# Patient Record
Sex: Female | Born: 1995 | Race: Black or African American | Hispanic: No | Marital: Single | Smoking: Never smoker
Health system: Southern US, Community
[De-identification: ages and names within clinical notes are randomized; demographics above are authoritative.]

## PROBLEM LIST (undated history)

## (undated) DIAGNOSIS — F32A Depression, unspecified: Secondary | ICD-10-CM

## (undated) DIAGNOSIS — G43909 Migraine, unspecified, not intractable, without status migrainosus: Secondary | ICD-10-CM

## (undated) DIAGNOSIS — T7840XA Allergy, unspecified, initial encounter: Secondary | ICD-10-CM

## (undated) DIAGNOSIS — F909 Attention-deficit hyperactivity disorder, unspecified type: Secondary | ICD-10-CM

## (undated) DIAGNOSIS — F329 Major depressive disorder, single episode, unspecified: Secondary | ICD-10-CM

## (undated) HISTORY — PX: TONSILLECTOMY: SUR1361

## (undated) HISTORY — PX: TYMPANOSTOMY TUBE PLACEMENT: SHX32

## (undated) HISTORY — DX: Migraine, unspecified, not intractable, without status migrainosus: G43.909

## (undated) HISTORY — DX: Allergy, unspecified, initial encounter: T78.40XA

## (undated) HISTORY — DX: Attention-deficit hyperactivity disorder, unspecified type: F90.9

---
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
## 2016-01-14 ENCOUNTER — Ambulatory Visit: Payer: Medicaid Other | Admitting: Diagnostic Neuroimaging

## 2016-02-02 ENCOUNTER — Ambulatory Visit: Payer: Medicaid Other | Admitting: Diagnostic Neuroimaging

## 2016-02-03 ENCOUNTER — Encounter: Payer: Self-pay | Admitting: Diagnostic Neuroimaging

## 2016-02-04 ENCOUNTER — Emergency Department (HOSPITAL_COMMUNITY)
Admission: EM | Admit: 2016-02-04 | Discharge: 2016-02-04 | Disposition: A | Discharge status: Home / Self Care | Payer: Medicaid Other | Attending: Emergency Medicine | Admitting: Emergency Medicine

## 2016-02-04 ENCOUNTER — Emergency Department (HOSPITAL_COMMUNITY): Payer: Medicaid Other

## 2016-02-04 ENCOUNTER — Encounter (HOSPITAL_COMMUNITY): Payer: Self-pay | Admitting: Emergency Medicine

## 2016-02-04 DIAGNOSIS — F909 Attention-deficit hyperactivity disorder, unspecified type: Secondary | ICD-10-CM | POA: Insufficient documentation

## 2016-02-04 DIAGNOSIS — G43909 Migraine, unspecified, not intractable, without status migrainosus: Secondary | ICD-10-CM | POA: Insufficient documentation

## 2016-02-04 DIAGNOSIS — S92032A Displaced avulsion fracture of tuberosity of left calcaneus, initial encounter for closed fracture: Secondary | ICD-10-CM | POA: Insufficient documentation

## 2016-02-04 DIAGNOSIS — S93402A Sprain of unspecified ligament of left ankle, initial encounter: Secondary | ICD-10-CM

## 2016-02-04 DIAGNOSIS — F329 Major depressive disorder, single episode, unspecified: Secondary | ICD-10-CM | POA: Insufficient documentation

## 2016-02-04 DIAGNOSIS — S92002A Unspecified fracture of left calcaneus, initial encounter for closed fracture: Secondary | ICD-10-CM

## 2016-02-04 DIAGNOSIS — Z7951 Long term (current) use of inhaled steroids: Secondary | ICD-10-CM | POA: Insufficient documentation

## 2016-02-04 DIAGNOSIS — Y998 Other external cause status: Secondary | ICD-10-CM | POA: Insufficient documentation

## 2016-02-04 DIAGNOSIS — Y9301 Activity, walking, marching and hiking: Secondary | ICD-10-CM | POA: Insufficient documentation

## 2016-02-04 DIAGNOSIS — Z79899 Other long term (current) drug therapy: Secondary | ICD-10-CM | POA: Insufficient documentation

## 2016-02-04 DIAGNOSIS — X58XXXA Exposure to other specified factors, initial encounter: Secondary | ICD-10-CM | POA: Insufficient documentation

## 2016-02-04 DIAGNOSIS — Y9289 Other specified places as the place of occurrence of the external cause: Secondary | ICD-10-CM | POA: Insufficient documentation

## 2016-02-04 MED ORDER — IBUPROFEN 600 MG PO TABS: 600.0000 mg | ORAL_TABLET | Freq: Four times a day (QID) | ORAL | Status: DC | PRN

## 2016-02-04 MED ORDER — HYDROCODONE-ACETAMINOPHEN 5-325 MG PO TABS: 1.0000 | ORAL_TABLET | ORAL | Status: DC | PRN

## 2016-02-04 MED ORDER — TRAMADOL HCL 50 MG PO TABS: 50.0000 mg | ORAL_TABLET | Freq: Four times a day (QID) | ORAL | Status: DC | PRN

## 2016-02-04 MED ORDER — HYDROCODONE-ACETAMINOPHEN 5-325 MG PO TABS
2.0000 | ORAL_TABLET | Freq: Once | ORAL | Status: AC
  Administered 2016-02-04: 2 via ORAL
  Filled 2016-02-04: qty 2

## 2016-02-04 NOTE — Discharge Instructions (Signed)

## 2016-02-04 NOTE — ED Notes (Signed)
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

## 2016-02-04 NOTE — ED Notes (Signed)
Bed: [id number] Expected date:  Expected time:  Means of arrival:  Comments: EMS 20yo F ankle injury

## 2016-02-04 NOTE — ED Provider Notes (Signed)
CSN: [id number]     Arrival date & time 02/04/16  2244 History   By signing my name below, I, [facility], attest that this documentation has been prepared under the direction and in the presence of [employer], PA-C. Electronically Signed: Virgel Bouquet, ED Scribe. 02/04/2016. 11:42 PM.     Chief Complaint  Patient presents with  . Ankle Pain    The history is provided by the patient. No language interpreter was used.   HPI Comments: Hannah Palmer is a 20 y.o. female who presents to the Emergency Department complaining of constant, mild left ankle pain after an injury that occurred earlier today. Patient reports that she was walking downstairs during a fire drill when she rolled her ankle, followed immediately by pain and swelling. She endorses associated swelling and difficulty walking secondary to pain. She has applied ice with relief of her swelling. Denies paraesthesia or numbness.  Past Medical History  Diagnosis Date  . ADHD (attention deficit hyperactivity disorder)   . Allergy   . Depression   . Migraine    Past Surgical History  Procedure Laterality Date  . Adenoidectomy  2001  . Tonsillectomy  Age 62  . Tympanostomy tube placement    . Base wedge osteotomy Right 02/27/2014    @ [place]  . Osteotomy Right 02/27/2014    Met Head Rt #5 @ [place]   Family History  Problem Relation Age of Onset  . Bipolar disorder Mother   . Stroke Mother   . ADD / ADHD Father   . Bipolar disorder Maternal Aunt   . Stroke Maternal Aunt   . Depression Maternal Grandmother   . Hypertension Maternal Grandmother   . Hyperlipidemia Maternal Grandmother   . Heart disease Maternal Grandmother   . Diabetes Maternal Grandmother   . Arthritis Maternal Grandmother   . Skin cancer Maternal Grandmother   . Diabetes Maternal Grandfather   . Stroke Maternal Grandfather   . Colon cancer Maternal Grandfather   . Diabetes Paternal Grandmother   . Diabetes Paternal Grandfather    Social  History  Substance Use Topics  . Smoking status: Passive Smoke Exposure - Never Smoker  . Smokeless tobacco: Never Used  . Alcohol Use: No   OB History    No data available     Review of Systems  Musculoskeletal: Positive for joint swelling (left ankle) and arthralgias (left ankle).  Neurological: Negative for numbness.  All other systems reviewed and are negative.   Allergies  Review of patient's allergies indicates no known allergies.  Home Medications   Prior to Admission medications   Medication Sig Start Date End Date Taking? Authorizing Provider  acetaminophen (TYLENOL) 325 MG tablet Take 650 mg by mouth every 6 (six) hours as needed.    Historical Provider, MD  amphetamine-dextroamphetamine (ADDERALL XR) 30 MG 24 hr capsule Take 1 capsule (30 mg total) by mouth daily with breakfast. 09/22/14   Marcha Solders, MD  Aspirin-Acetaminophen-Caffeine (EXCEDRIN EXTRA STRENGTH PO) Take by mouth. As needed    Historical Provider, MD  citalopram (CELEXA) 20 MG tablet Take 20 mg by mouth.    Historical Provider, MD  fluticasone (FLONASE) 50 MCG/ACT nasal spray Place 1 spray into the nose. 06/11/14   Historical Provider, MD  GUANFACINE HCL PO Take 1 tablet by mouth daily.    Historical Provider, MD  HYDROcodone-acetaminophen (NORCO/VICODIN) 5-325 MG tablet Take 1 tablet by mouth every 4 (four) hours as needed for severe pain. 02/04/16   Antonietta Breach,  PA-C  ibuprofen (ADVIL,MOTRIN) 600 MG tablet Take 1 tablet (600 mg total) by mouth every 6 (six) hours as needed. 02/04/16   Antonietta Breach, PA-C  polyethylene glycol powder (GLYCOLAX/MIRALAX) powder Take 17 g by mouth daily. 12/02/15   Marcha Solders, MD  senna (SENOKOT) 8.6 MG TABS tablet Take 1 tablet (8.6 mg total) by mouth daily. 12/22/15 03/20/16  Leveda Anna, NP  sodium chloride (OCEAN) 0.65 % SOLN nasal spray Place 2 sprays into both nostrils as needed for congestion. 12/22/15 01/19/16  Leveda Anna, NP  topiramate (TOPAMAX) 50 MG tablet Take 1  tablet (50 mg total) by mouth 2 (two) times daily. 12/03/15   Penni Bombard, MD  vitamin B-12 (CYANOCOBALAMIN) 1000 MCG tablet Take 1 tablet (1,000 mcg total) by mouth daily. Patient may resume home supply 12/14/12   Aurelio Jew, NP   BP 129/76 mmHg  Pulse 78  Temp(Src) 98.8 F (37.1 C) (Oral)  Resp 16  SpO2 99%  LMP  (LMP Unknown)   Physical Exam  Constitutional: She is oriented to person, place, and time. She appears well-developed and well-nourished. No distress.  HENT:  Head: Normocephalic and atraumatic.  Eyes: Conjunctivae and EOM are normal. No scleral icterus.  Neck: Normal range of motion.  Cardiovascular: Normal rate, regular rhythm and intact distal pulses.   DP and PT pulses 2+ in the LLE  Pulmonary/Chest: Effort normal. No respiratory distress.  Respirations even and unlabored  Musculoskeletal: Normal range of motion.       Left ankle: She exhibits swelling. She exhibits normal range of motion, no deformity and normal pulse. Tenderness. Lateral malleolus and medial malleolus tenderness found. Achilles tendon normal.       Feet:  Neurological: She is alert and oriented to person, place, and time. She exhibits normal muscle tone. Coordination normal.  Sensation to light touch intact. Patient able to wiggle all toes.  Skin: Skin is warm and dry. No rash noted. She is not diaphoretic. No erythema. No pallor.  Psychiatric: She has a normal mood and affect. Her behavior is normal.  Nursing note and vitals reviewed.   ED Course  Procedures   DIAGNOSTIC STUDIES: Oxygen Saturation is 99% on RA, normal by my interpretation.    COORDINATION OF CARE: 11:12 PM Ordered left ankle x-ray. Will return to discuss results of left ankle imaging. Discussed treatment plan with pt at bedside and pt agreed to plan.  11:40 PM Will prescribe ibuprofen, Ultram, and Norco. Applied ASO wrap and provided pt with crutches.  Imaging Review Dg Ankle Complete Left  02/04/2016  CLINICAL  DATA:  Twisted ankle going down stairs tonight. Lateral pain and swelling. Initial encounter. EXAM: LEFT ANKLE COMPLETE - 3+ VIEW COMPARISON:  Left foot radiographs 04/27/2015 FINDINGS: There is mild soft tissue swelling about the lateral aspect of the ankle. There is suboptimal positioning on the mortise view. No fracture of the distal tibia or fibula is identified. No dislocation is identified. There is a 3 mm osseous fragment adjacent to the anterolateral aspect of the calcaneus on the AP projection suggestive of a small avulsion fracture. No radiopaque foreign body. IMPRESSION: Likely small avulsion fracture from the antro lateral calcaneus. Soft tissue swelling. Electronically Signed   By: Logan Bores M.D.   On: 02/04/2016 23:33   I have personally reviewed and evaluated these images as part of my medical decision-making.   MDM   Final diagnoses:  Avulsion fracture of calcaneus, left, closed, initial encounter  Ankle sprain,  left, initial encounter    20 year old female presents to the emergency department for evaluation of left ankle pain after rolling her ankle during a fire drill. Patient is neurovascularly intact. Comparments soft. Most swelling and tenderness appreciated around the lateral malleolus. X-ray shows a small avulsion fracture to the lateral calcaneus. Patient placed in an ASO ankle brace. She has crutches in her dorm for [employer]. No indication for further emergent workup at this time. Patient discharged with instructions for NSAIDs and icing. Patient discharged in good condition with no unaddressed concerns.  I personally performed the services described in this documentation, which was scribed in my presence. The recorded information has been reviewed and is accurate.    Filed Vitals:   02/04/16 2253  BP: 129/76  Pulse: 78  Temp: 98.8 F (37.1 C)  TempSrc: Oral  Resp: 16  SpO2: 99%      Antonietta Breach, PA-C 02/05/16 [place], DO 02/06/16 [phone]

## 2016-02-05 IMAGING — CR DG KNEE AP/LAT W/ SUNRISE*R*
3 series · 3 of 3 positions shown · non-contrast
Comparison: None.

CLINICAL DATA: Right knee pain. No known injury. Initial evaluation
.

EXAM:
RIGHT KNEE 3 VIEWS

[w knee ap right]
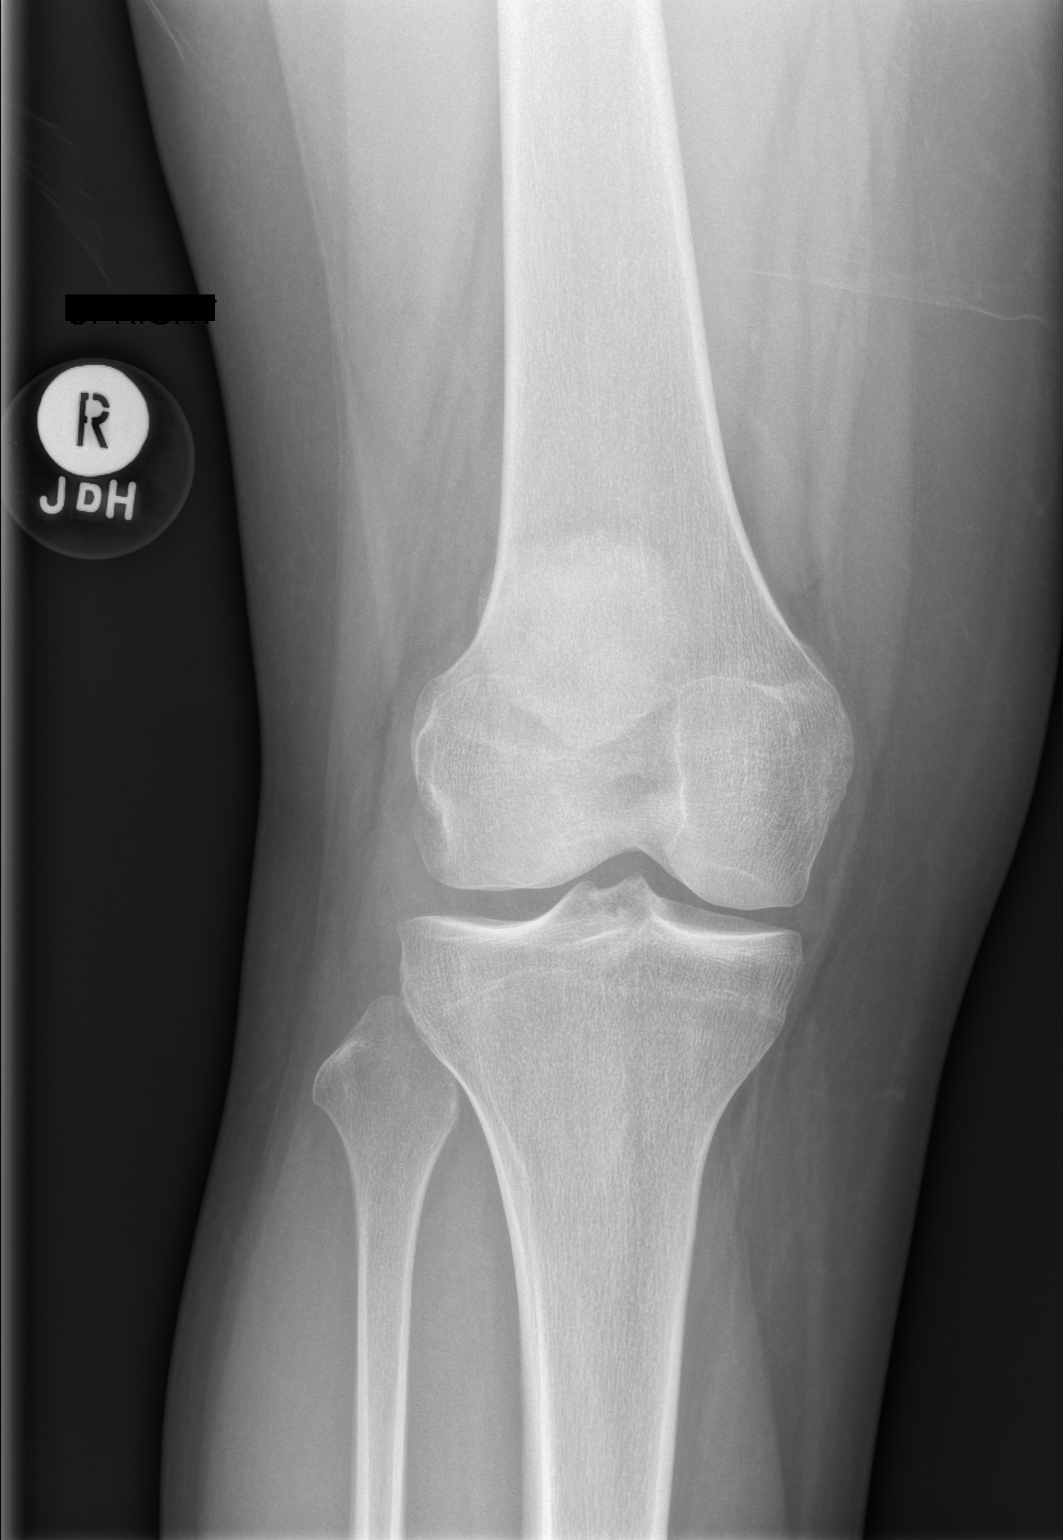

[w knee lat right]
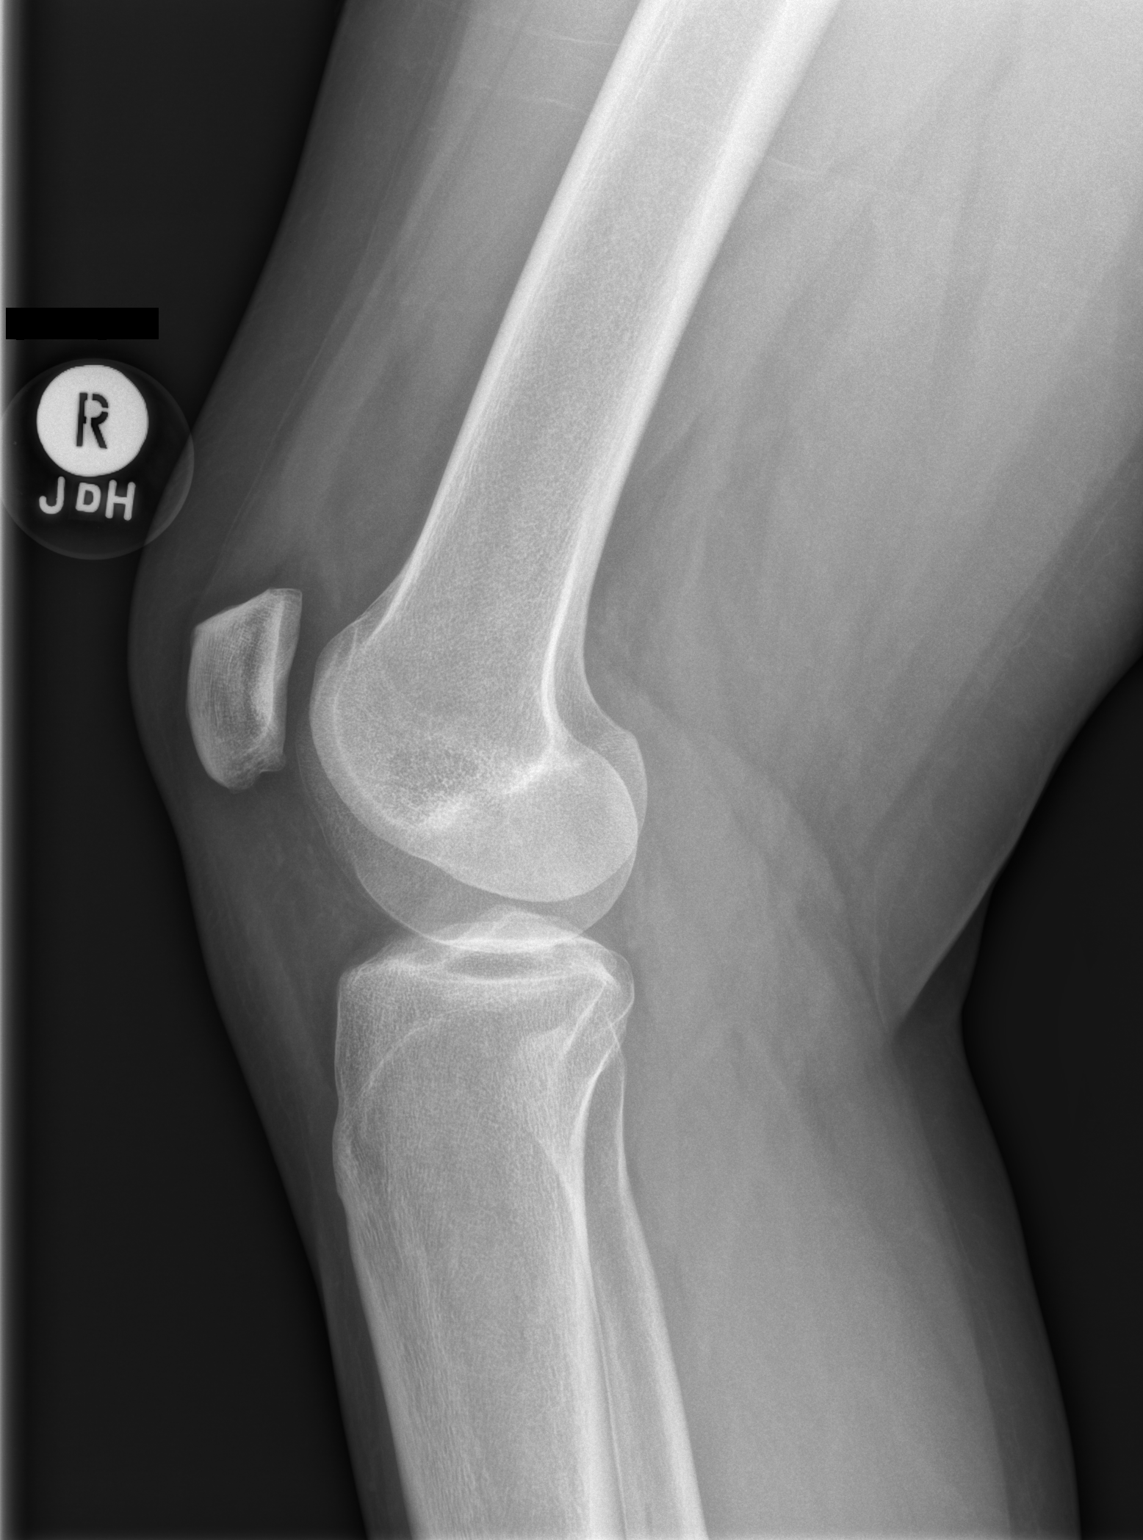

[x knee sunrise right]
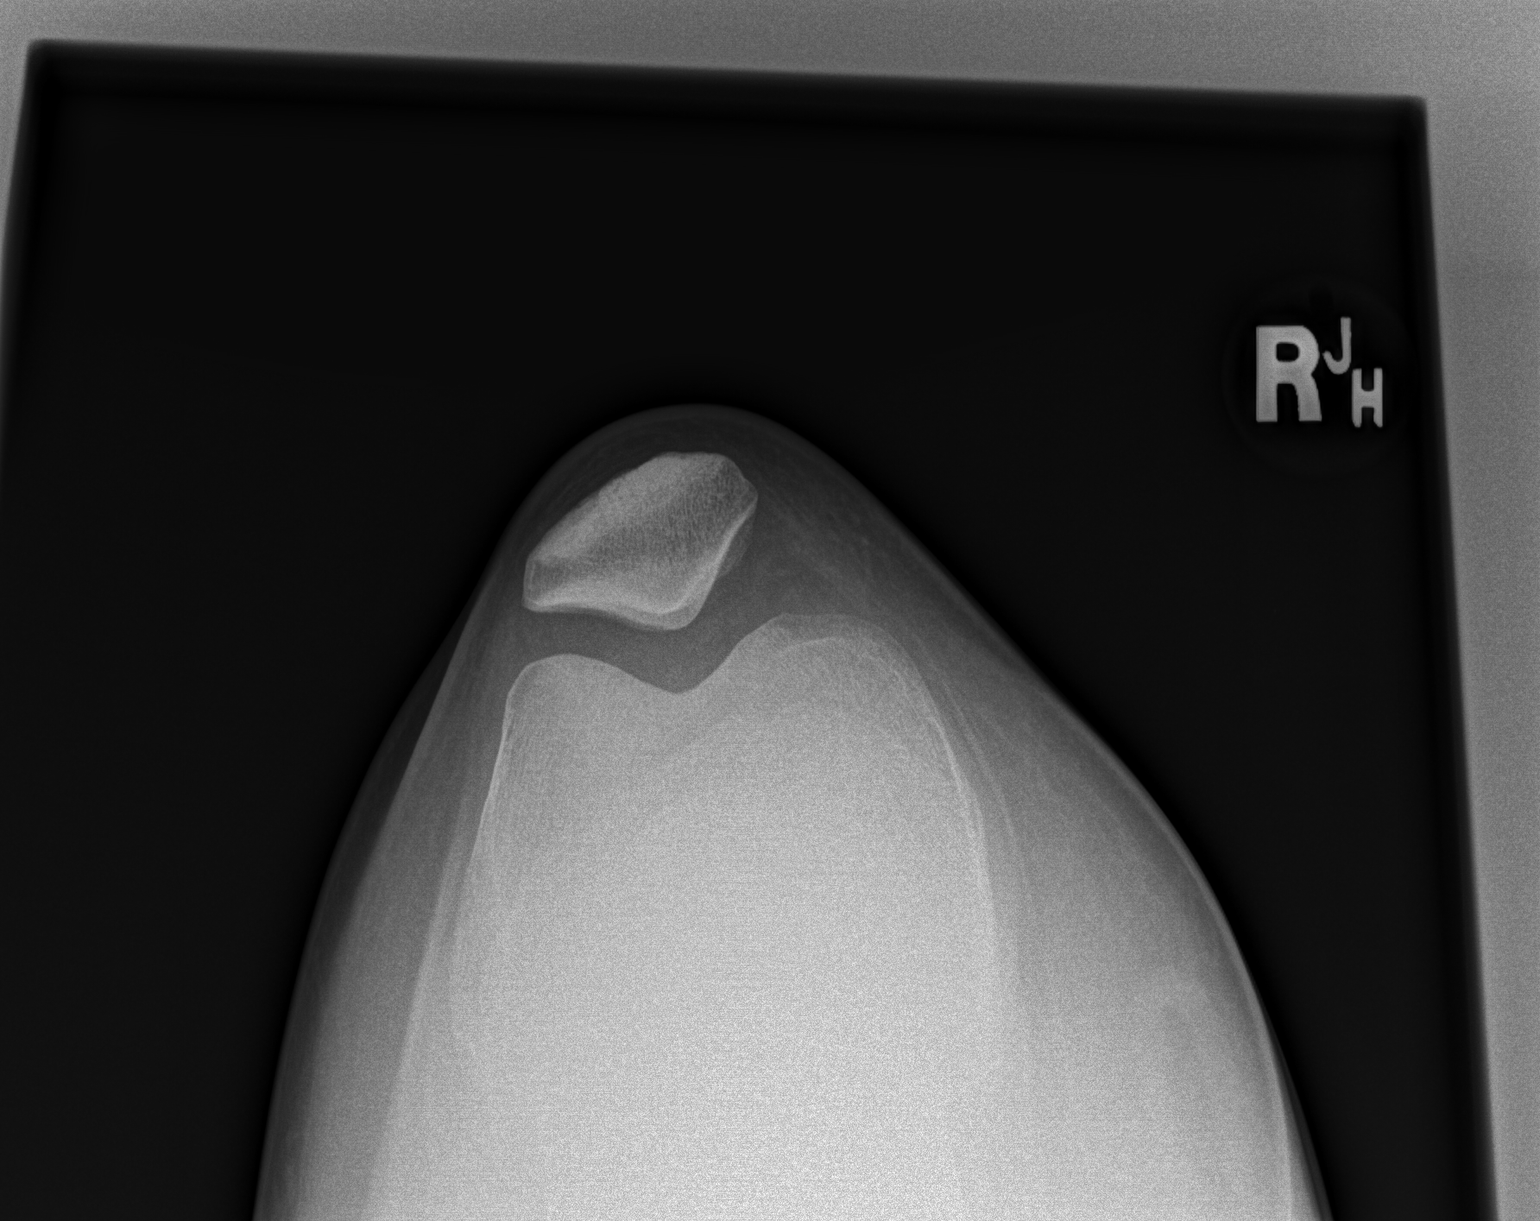

[3 of 3 positions shown; findings below may reference images not displayed]

FINDINGS: No acute bony or joint abnormality identified. No evidence of
fracture or dislocation.
IMPRESSION: No acute abnormality.

## 2016-03-01 ENCOUNTER — Encounter: Payer: Self-pay | Admitting: Diagnostic Neuroimaging

## 2016-03-01 ENCOUNTER — Ambulatory Visit (INDEPENDENT_AMBULATORY_CARE_PROVIDER_SITE_OTHER): Payer: Medicaid Other | Admitting: Diagnostic Neuroimaging

## 2016-03-01 VITALS — BP 117/75 | HR 98 | Ht 67.75 in | Wt 237.0 lb

## 2016-03-01 DIAGNOSIS — G43009 Migraine without aura, not intractable, without status migrainosus: Secondary | ICD-10-CM | POA: Diagnosis not present

## 2016-03-01 MED ORDER — TOPIRAMATE 50 MG PO TABS: 50.0000 mg | ORAL_TABLET | Freq: Two times a day (BID) | ORAL | Status: DC

## 2016-03-01 NOTE — Patient Instructions (Signed)
Thank you for coming to see [place] at [facility]. I hope we have been able to provide you high quality care today.  You may receive a patient satisfaction survey over the next few weeks. We would appreciate your feedback and comments so that we may continue to improve ourselves and the health of our patients.  - continue topiramate 12m at bedtime   ~~~~~~~~~~~~~~~~~~~~~~~~~~~~~~~~~~~~~~~~~~~~~~~~~~~~~~~~~~~~~~~~~  DR. Evadna Donaghy'S GUIDE TO HAPPY AND HEALTHY LIVING These are some of my general health and wellness recommendations. Some of them may apply to you better than others. Please use common sense as you try these suggestions and feel free to ask me any questions.   ACTIVITY/FITNESS Mental, social, emotional and physical stimulation are very important for brain and body health. Try learning a new activity (arts, music, language, sports, games).  Keep moving your body to the best of your abilities. You can do this at home, inside or outside, the park, community center, gym or anywhere you like. Consider a physical therapist or personal trainer to get started. Consider the app Sworkit. Fitness trackers such as smart-watches, smart-phones or Fitbits can help as well.   NUTRITION Eat more plants: colorful vegetables, nuts, seeds and berries.  Eat less sugar, salt, preservatives and processed foods.  Avoid toxins such as cigarettes and alcohol.  Drink water when you are thirsty. Warm water with a slice of lemon is an excellent morning drink to start the day.  Consider these websites for more information The Nutrition Source (hhttps://www.henry-hernandez.biz/ Precision Nutrition (wWindowBlog.ch   RELAXATION Consider practicing mindfulness meditation or other relaxation techniques such as deep breathing, prayer, yoga, tai chi, massage. See website mindful.org or the apps Headspace or Calm to help get started.   SLEEP Try  to get at least 7-8+ hours sleep per day. Regular exercise and reduced caffeine will help you sleep better. Practice good sleep hygeine techniques. See website sleep.org for more information.   PLANNING Prepare estate planning, living will, healthcare POA documents. Sometimes this is best planned with the help of an attorney. Theconversationproject.org and agingwithdignity.org are excellent resources.

## 2016-03-01 NOTE — Progress Notes (Signed)
[facility]  PATIENT: Hannah Palmer DOB: [birth year]  REFERRING CLINICIAN: Leafy Ro, S HISTORY FROM: patient  REASON FOR VISIT: follow up    HISTORICAL  CHIEF COMPLAINT:  Chief Complaint  Patient presents with  . Migraine    rm 7, "migraines a little better, double vision when I wake up, goes away in 5-10 minutes"  . Follow-up    HISTORY OF PRESENT ILLNESS:   UPDATE 03/01/16: Since last visit, on TPX (111m qhs) and doing a little better. Having 3-4 migraine per month. Having 5 tension HA per month.   UPDATE 12/03/15: Since last visit, HA are stable. Avg 8 migraine per month; 2-4 tension HA per month. MRI brain unremarkable. Anxiety and insomnia are still issues.  PRIOR HPI (10/16/15): 20year old left-handed female here for evaluation of headaches. For past 1-2 years patient has had onset of 2 types of headaches. She describes one as a bandlike squeezing tension headache which is intermittent. This has somewhat resolved over the last few months. However in other type of headache has been present and gradually been increasing. She describes this as a throbbing stabbing severe headache with eye pain, unilateral right or left side, associated with nausea, photophobia and phonophobia. These headaches can last hours at a time. She is averaging 1-2 of the severe headaches per week. Typically she has to lay down in a dark quiet room and wait for symptoms to resolve. August 2015 patient was also having intermittent episodes of dizziness, clamminess, sweating, motion sickness. More recently she went to ENT for evaluation of these dizzy spells and was found to have no specific ear related pathology. She was advised to come to neurology for further evaluation of possible migraine phenomenon. Patient has family history of migraine in her maternal uncle and aunt.   REVIEW OF SYSTEMS: Full 14 system review of systems performed and negative except:   Review of Systems  HENT:  Positive for tinnitus.        Runny nose  Eyes: Positive for double vision and photophobia.  Gastrointestinal: Positive for diarrhea and constipation.  Musculoskeletal: Positive for back pain.  Neurological: Positive for dizziness.       Headache  Psychiatric/Behavioral: Positive for depression. The patient is nervous/anxious.        Hyperactive, decr concentration    ALLERGIES: No Known Allergies  HOME MEDICATIONS: Outpatient Prescriptions Prior to Visit  Medication Sig Dispense Refill  . acetaminophen (TYLENOL) 325 MG tablet Take 650 mg by mouth every 6 (six) hours as needed.    .Marland Kitchenamphetamine-dextroamphetamine (ADDERALL XR) 30 MG 24 hr capsule Take 1 capsule (30 mg total) by mouth daily with breakfast. 30 capsule 0  . Aspirin-Acetaminophen-Caffeine (EXCEDRIN EXTRA STRENGTH PO) Take by mouth. As needed    . citalopram (CELEXA) 20 MG tablet Take 20 mg by mouth.    . fluticasone (FLONASE) 50 MCG/ACT nasal spray Place 1 spray into the nose.    .Marland KitchenGUANFACINE HCL PO Take 1 tablet by mouth daily.    .Marland KitchenHYDROcodone-acetaminophen (NORCO/VICODIN) 5-325 MG tablet Take 1 tablet by mouth every 4 (four) hours as needed for severe pain. 13 tablet 0  . ibuprofen (ADVIL,MOTRIN) 600 MG tablet Take 1 tablet (600 mg total) by mouth every 6 (six) hours as needed. 30 tablet 0  . polyethylene glycol powder (GLYCOLAX/MIRALAX) powder Take 17 g by mouth daily. 527 g 12  . senna (SENOKOT) 8.6 MG TABS tablet Take 1 tablet (8.6 mg total) by mouth daily. 120 each 3  .  topiramate (TOPAMAX) 50 MG tablet Take 1 tablet (50 mg total) by mouth 2 (two) times daily. 60 tablet 12  . vitamin B-12 (CYANOCOBALAMIN) 1000 MCG tablet Take 1 tablet (1,000 mcg total) by mouth daily. Patient may resume home supply    . sodium chloride (OCEAN) 0.65 % SOLN nasal spray Place 2 sprays into both nostrils as needed for congestion. 1 Bottle 1   No facility-administered medications prior to visit.    PAST MEDICAL HISTORY: Past Medical  History  Diagnosis Date  . ADHD (attention deficit hyperactivity disorder)   . Allergy   . Depression   . Migraine     PAST SURGICAL HISTORY: Past Surgical History  Procedure Laterality Date  . Adenoidectomy  2001  . Tonsillectomy  Age 18  . Tympanostomy tube placement    . Base wedge osteotomy Right 02/27/2014    @ [place]  . Osteotomy Right 02/27/2014    Met Head Rt #5 @ [facility]    FAMILY HISTORY: Family History  Problem Relation Age of Onset  . Bipolar disorder Mother   . Stroke Mother   . ADD / ADHD Father   . Bipolar disorder Maternal Aunt   . Stroke Maternal Aunt   . Depression Maternal Grandmother   . Hypertension Maternal Grandmother   . Hyperlipidemia Maternal Grandmother   . Heart disease Maternal Grandmother   . Diabetes Maternal Grandmother   . Arthritis Maternal Grandmother   . Skin cancer Maternal Grandmother   . Diabetes Maternal Grandfather   . Stroke Maternal Grandfather   . Colon cancer Maternal Grandfather   . Diabetes Paternal Grandmother   . Diabetes Paternal Grandfather     SOCIAL HISTORY:  Social History   Social History  . Marital Status: Single    Spouse Name: N/A  . Number of Children: 0  . Years of Education: Student   Occupational History  . Student     [employer]   Social History Main Topics  . Smoking status: Passive Smoke Exposure - Never Smoker  . Smokeless tobacco: Never Used  . Alcohol Use: No  . Drug Use: No  . Sexual Activity: No   Other Topics Concern  . Not on file   Social History Narrative   Patient lives at home with her grandmother on the weekends. Lives in [place] at [place] during the week.   Caffeine Use: 1 cup daily      PHYSICAL EXAM  GENERAL EXAM/CONSTITUTIONAL: Vitals:  Filed Vitals:   03/01/16 1446  BP: 117/75  Pulse: 98  Height: 5' 7.75" (1.721 m)  Weight: 237 lb (107.502 kg)   Body mass index is 36.3 kg/(m^2). No exam data present  Patient is in no distress; well developed, nourished and groomed; neck  is supple  CARDIOVASCULAR:  Examination of carotid arteries is normal; no carotid bruits  Regular rate and rhythm, no murmurs  Examination of peripheral vascular system by observation and palpation is normal  EYES:  Ophthalmoscopic exam of optic discs and posterior segments is normal; no papilledema or hemorrhages  MUSCULOSKELETAL:  Gait, strength, tone, movements noted in Neurologic exam below  NEUROLOGIC: MENTAL STATUS:  No flowsheet data found.  awake, alert, oriented to person, place and time  recent and remote memory intact  normal attention and concentration  language fluent, comprehension intact, naming intact,   fund of knowledge appropriate  CRANIAL NERVE:   2nd - no papilledema on fundoscopic exam  2nd, 3rd, 4th, 6th - pupils equal and reactive to light, visual  fields full to confrontation, extraocular muscles intact, no nystagmus  5th - facial sensation symmetric  7th - facial strength symmetric  8th - hearing intact  9th - palate elevates symmetrically, uvula midline  11th - shoulder shrug symmetric  12th - tongue protrusion midline  MOTOR:   normal bulk and tone, full strength in the BUE, BLE  SENSORY:   normal and symmetric to light touch, temperature, vibration   COORDINATION:   finger-nose-finger, fine finger movements normal  REFLEXES:   deep tendon reflexes present and symmetric  GAIT/STATION:   narrow based gait; romberg is negative    DIAGNOSTIC DATA (LABS, IMAGING, TESTING) - I reviewed patient records, labs, notes, testing and imaging myself where available.  Lab Results  Component Value Date   WBC 6.6 12/08/2012   HGB 12.4 12/08/2012   HCT 35.6 12/08/2012   MCV 84.2 12/08/2012   PLT 304 12/08/2012      Component Value Date/Time   NA 137 12/08/2012 2222   K 3.6 12/08/2012 2222   CL 103 12/08/2012 2222   CO2 25 12/08/2012 2222   GLUCOSE 84 12/08/2012 2222   BUN 8 12/08/2012 2222   CREATININE 0.67  12/08/2012 2222   CALCIUM 9.1 12/08/2012 2222   PROT 7.1 12/08/2012 2222   ALBUMIN 3.8 12/08/2012 2222   AST 11 12/08/2012 2222   ALT 7 12/08/2012 2222   ALKPHOS 76 12/08/2012 2222   BILITOT 0.2* 12/08/2012 2222   GFRNONAA NOT CALCULATED 12/08/2012 2222   GFRAA NOT CALCULATED 12/08/2012 2222   No results found for: CHOL, HDL, LDLCALC, LDLDIRECT, TRIG, CHOLHDL No results found for: HGBA1C No results found for: VITAMINB12 Lab Results  Component Value Date   TSH 1.030 12/10/2012    10/20/15 MRI brain [I reviewed images myself and agree with interpretation. -VRP]  - normal    ASSESSMENT AND PLAN  20 y.o. year old female here with 1-2 year history of mixed headaches with tension headaches and migraine without aura. Topiramate helping a little bit.    Dx:  Migraine without aura and without status migrainosus, not intractable    PLAN: I spent 15 minutes of face to face time with patient. Greater than 50% of time was spent in counseling and coordination of care with patient. In summary we discussed:   - continue topiramate 152m at bedtime - consider migraine rescue meds (patient wants to hold off for now) - advised to work on nutrition, physical activity, stress mgmt and sleep  Meds ordered this encounter  Medications  . topiramate (TOPAMAX) 50 MG tablet    Sig: Take 1 tablet (50 mg total) by mouth 2 (two) times daily.    Dispense:  60 tablet    Refill:  12   Return in about 6 months (around 08/31/2016).    VPenni Bombard MD 48/38/1840 33:75PM Certified in Neurology, Neurophysiology and Neuroimaging  [facility] [address] [place]amar[place]aft [place] [phone]

## 2016-04-18 ENCOUNTER — Encounter (HOSPITAL_COMMUNITY): Payer: Self-pay | Admitting: *Deleted

## 2016-04-18 ENCOUNTER — Ambulatory Visit (HOSPITAL_COMMUNITY)
Admission: EM | Admit: 2016-04-18 | Discharge: 2016-04-18 | Disposition: A | Discharge status: Home / Self Care | Payer: Medicaid Other | Attending: Emergency Medicine | Admitting: Emergency Medicine

## 2016-04-18 DIAGNOSIS — M7581 Other shoulder lesions, right shoulder: Secondary | ICD-10-CM | POA: Diagnosis not present

## 2016-04-18 MED ORDER — IBUPROFEN 600 MG PO TABS: 600.0000 mg | ORAL_TABLET | Freq: Three times a day (TID) | ORAL | Status: DC

## 2016-04-18 NOTE — ED Provider Notes (Signed)
CSN: [id number]     Arrival date & time 04/18/16  1556 History   First MD Initiated Contact with Patient 04/18/16 1622     Chief Complaint  Patient presents with  . Shoulder Pain   (Consider location/radiation/quality/duration/timing/severity/associated sxs/prior Treatment) HPI  She is a 20 year old woman here with her grandmother for evaluation of right shoulder pain. Pain is primarily in the top of her shoulder. It is worse with overhead movements. No radiating pain. She denies any injury or trauma. She states she did lift a box yesterday, but states it was not that heavy. Over the last week she has been doing a lot of repetitive overhead movements in a dance class. 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  Past Medical History  Diagnosis Date  . ADHD (attention deficit hyperactivity disorder)   . Allergy   . Depression   . Migraine    Past Surgical History  Procedure Laterality Date  . Adenoidectomy  2001  . Tonsillectomy  Age 52  . Tympanostomy tube placement    . Base wedge osteotomy Right 02/27/2014    @ [place]  . Osteotomy Right 02/27/2014    Met Head Rt #5 @ [place]   Family History  Problem Relation Age of Onset  . Bipolar disorder Mother   . Stroke Mother   . ADD / ADHD Father   . Bipolar disorder Maternal Aunt   . Stroke Maternal Aunt   . Depression Maternal Grandmother   . Hypertension Maternal Grandmother   . Hyperlipidemia Maternal Grandmother   . Heart disease Maternal Grandmother   . Diabetes Maternal Grandmother   . Arthritis Maternal Grandmother   . Skin cancer Maternal Grandmother   . Diabetes Maternal Grandfather   . Stroke Maternal Grandfather   . Colon cancer Maternal Grandfather   . Diabetes Paternal Grandmother   . Diabetes Paternal Grandfather    Social History  Substance Use Topics  . Smoking status: Passive Smoke Exposure - Never Smoker  . Smokeless tobacco: Never Used  . Alcohol Use: No   OB History    No data  available     Review of Systems As in history of present illness Allergies  Review of patient's allergies indicates no known allergies.  Home Medications   Prior to Admission medications   Medication Sig Start Date End Date Taking? Authorizing Provider  amphetamine-dextroamphetamine (ADDERALL XR) 30 MG 24 hr capsule Take 1 capsule (30 mg total) by mouth daily with breakfast. 09/22/14  Yes Marcha Solders, MD  citalopram (CELEXA) 20 MG tablet Take 20 mg by mouth.   Yes Historical Provider, MD  fluticasone (FLONASE) 50 MCG/ACT nasal spray Place 1 spray into the nose. 06/11/14  Yes Historical Provider, MD  GUANFACINE HCL PO Take 1 tablet by mouth daily.   Yes Historical Provider, MD  acetaminophen (TYLENOL) 325 MG tablet Take 650 mg by mouth every 6 (six) hours as needed.    Historical Provider, MD  Aspirin-Acetaminophen-Caffeine (EXCEDRIN EXTRA STRENGTH PO) Take by mouth. As needed    Historical Provider, MD  ibuprofen (ADVIL,MOTRIN) 600 MG tablet Take 1 tablet (600 mg total) by mouth 3 (three) times daily. 04/18/16   Melony Overly, MD  sodium chloride (OCEAN) 0.65 % SOLN nasal spray Place 2 sprays into both nostrils as needed for congestion. 12/22/15 01/19/16  Leveda Anna, NP  topiramate (TOPAMAX) 50 MG tablet Take 1 tablet (50 mg total) by mouth 2 (two) times daily. 03/01/16   Vikram R  Penumalli, MD   Meds Ordered and Administered this Visit  Medications - No data to display  BP 116/79 mmHg  Pulse 72  Temp(Src) 98.8 F (37.1 C) (Oral)  Resp 16  SpO2 98% No data found.   Physical Exam  Constitutional: She is oriented to person, place, and time. She appears well-developed and well-nourished. No distress.  Cardiovascular: Normal rate.   Pulmonary/Chest: Effort normal.  Musculoskeletal:  Right shoulder: No erythema, bruising, or swelling. She is tender along the supraspinatus. No bony tenderness. No tenderness at the bicipital groove. Pain with passive abduction beyond 90. Positive empty  can and Hawkins.  Neurological: She is alert and oriented to person, place, and time.    ED Course  Procedures (including critical care time)  Labs Review Labs Reviewed - No data to display  Imaging Review No results found.    MDM   1. Rotator cuff tendonitis, right    Conservative management with ice, ibuprofen, and rest. Discussed range of motion exercises. Expect improvement over one to 2 weeks.  Given history of loose ligaments, exercises given to strengthen the rotator cuff once pain is improved. Follow-up as needed.    Melony Overly, MD 04/18/16 [phone]

## 2016-04-18 NOTE — ED Notes (Signed)
Patient with right shoulder pain since yesterday, no injury noted, CMS intact, reports no pain with rest only pain with add and abduction.

## 2016-04-18 NOTE — Discharge Instructions (Signed)
You have irritated your rotator cuff tendon. Apply ice as often as you can for the next 5 days. Take ibuprofen 600 mg 3 times a day for the next 5 days, then as needed. Do gentle range of motion of the shoulder with the arm hanging off the side of your bed. Work on the strengthening exercises I showed you once the pain is improved. This will likely take 1-2 weeks to resolve. Follow-up as needed.  STRENGTH - Internal Rotators, Isometric   Keep your right / left elbow at your side and bend it 90 degrees.  Step into a door frame so that the inside of your right / left wrist can press against the door frame without your upper arm leaving your side.  Gently press your right / left wrist into the door frame as if you were trying to draw the palm of your hand to your abdomen. Gradually increase the tension until you are pressing as hard as you can without shrugging your shoulder or increasing any shoulder discomfort.  Hold __________ seconds.  Release the tension slowly. Relax your shoulder muscles completely before you do the next repetition. Repeat __________ times. Complete this exercise __________ times per day.  STRENGTH - External Rotators, Isometric   Keep your right / left elbow at your side and bend it 90 degrees.  Step into a door frame so that the outside of your right / left wrist can press against the door frame without your upper arm leaving your side.  Gently press your right / left wrist into the door frame as if you were trying to swing the back of your hand away from your abdomen. Gradually increase the tension until you are pressing as hard as you can without shrugging your shoulder or increasing any shoulder discomfort.  Hold __________ seconds.  Release the tension slowly. Relax your shoulder muscles completely before you do the next repetition. Repeat __________ times. Complete this exercise __________ times per day.    STRENGTH - Plantar-flexors, Eccentric Note: This  exercise can place a lot of stress on your foot and ankle. Please complete this exercise only if specifically instructed by your caregiver.   Place the balls of your feet on a step. With your hands, use only enough support from a wall or rail to keep your balance.  Keep your knees straight and rise up on your toes.  Slowly shift your weight entirely to your toes and pick up your opposite foot. Gently and with controlled movement, lower your weight through your right / left foot so that your heel drops below the level of the step. You will feel a slight stretch in the back of your calf at the ending position.  Use the healthy leg to help rise up onto the balls of both feet, then lower weight only on the right / left leg again. Build up to 15 repetitions. Then progress to 3 consecutive sets of 15 repetitions.*  After completing the above exercise, complete the same exercise with a slight knee bend (about 30 degrees). Again, build up to 15 repetitions. Then progress to 3 consecutive sets of 15 repetitions.* Perform this exercise __________ times per day.  *When you easily complete 3 sets of 15, your physician, physical therapist, or athletic trainer may advise you to add resistance by wearing a backpack filled with additional weight.

## 2016-06-22 ENCOUNTER — Ambulatory Visit: Payer: Medicaid Other | Admitting: Pediatrics

## 2016-07-07 ENCOUNTER — Encounter: Payer: Self-pay | Admitting: Pediatrics

## 2016-07-07 ENCOUNTER — Ambulatory Visit (INDEPENDENT_AMBULATORY_CARE_PROVIDER_SITE_OTHER): Payer: Medicaid Other | Admitting: Pediatrics

## 2016-07-07 ENCOUNTER — Telehealth: Payer: Self-pay | Admitting: *Deleted

## 2016-07-07 ENCOUNTER — Ambulatory Visit: Payer: Self-pay

## 2016-07-07 VITALS — Wt 239.1 lb

## 2016-07-07 DIAGNOSIS — Z0100 Encounter for examination of eyes and vision without abnormal findings: Secondary | ICD-10-CM | POA: Diagnosis not present

## 2016-07-07 NOTE — Telephone Encounter (Signed)
LVM requesting patient call back to reschedule FU in Oct due to Dr Leta Baptist being in a meeting. Left name, number and advised her the phone staff may reschedule her follow up.

## 2016-07-07 NOTE — Patient Instructions (Signed)
Move to sitting in the front of the classroom If you continue to have issues while sitting at the front of the room, call and will refer to the eye doctor  Try calling [facility]

## 2016-07-07 NOTE — Progress Notes (Signed)
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 She states that sometimes, during lectures, some words and/or letters are blurry and hard to see. Patient sits in the back of the auditorium.   Vision screen, uncorrected: R-20/20, L-20/25  Instructed patient to sit closer to the front of the auditorium. If patient continues to have difficulty after consistently sitting in the front of the auditorium, will refer to ophthalmology for vision exam.

## 2016-07-14 NOTE — Telephone Encounter (Signed)
Left 2nd vm requesting patient call back and reschedule her FU.  Advised the phone staff may reschedule for her. Left name, number.

## 2016-08-09 ENCOUNTER — Telehealth: Payer: Self-pay | Admitting: *Deleted

## 2016-08-09 NOTE — Telephone Encounter (Signed)
LVM informing patient her FU was rescheduled for 4 days out from previous appointment. This was necessary due to a conflicting appointment of hers as well as Dr Leta Baptist being in a meeting that may have interfered. Advised her of new FU on 09/05/16, arrive at 1:45 pm. Advised she will get a reminder call as well. Left name and number.

## 2016-09-01 ENCOUNTER — Ambulatory Visit: Payer: Medicaid Other | Admitting: Diagnostic Neuroimaging

## 2016-09-01 ENCOUNTER — Ambulatory Visit: Payer: Medicaid Other | Admitting: Pediatrics

## 2016-09-05 ENCOUNTER — Ambulatory Visit: Payer: Self-pay | Admitting: Diagnostic Neuroimaging

## 2016-09-20 ENCOUNTER — Encounter: Payer: Self-pay | Admitting: Diagnostic Neuroimaging

## 2016-09-20 ENCOUNTER — Ambulatory Visit (INDEPENDENT_AMBULATORY_CARE_PROVIDER_SITE_OTHER): Payer: Medicaid Other | Admitting: Diagnostic Neuroimaging

## 2016-09-20 VITALS — BP 105/68 | HR 77 | Wt 235.0 lb

## 2016-09-20 DIAGNOSIS — G43009 Migraine without aura, not intractable, without status migrainosus: Secondary | ICD-10-CM | POA: Diagnosis not present

## 2016-09-20 MED ORDER — TOPIRAMATE 50 MG PO TABS: 50.0000 mg | ORAL_TABLET | Freq: Two times a day (BID) | ORAL | 12 refills | Status: DC

## 2016-09-20 NOTE — Progress Notes (Signed)
[facility]  PATIENT: Hannah Palmer DOB: 04/30/1996  REFERRING CLINICIAN: Leafy Ro, S HISTORY FROM: patient  REASON FOR VISIT: follow up    HISTORICAL  CHIEF COMPLAINT:  Chief Complaint  Patient presents with  . Migraine    rm 6, "If i have HA or migraines my eyes are senstive to light; pain/pressure in eyes not necessarily with migraine  . Follow-up    6 month    HISTORY OF PRESENT ILLNESS:   UPDATE 09/20/16: Since last visit, still on TPX 80m twice a day. HA are well controlled.   UPDATE 03/01/16: Since last visit, on TPX ([id number]) and doing a little better. Having 3-4 migraine per month. Having 5 tension HA per month.   UPDATE 12/03/15: Since last visit, HA are stable. Avg 8 migraine per month; 2-4 tension HA per month. MRI brain unremarkable. Anxiety and insomnia are still issues.  PRIOR HPI (10/16/15): [age]ear old left-handed female here for evaluation of headaches. For past 1-2 years patient has had onset of 2 types of headaches. She describes one as a bandlike squeezing tension headache which is intermittent. This has somewhat resolved over the last few months. However in other type of headache has been present and gradually been increasing. She describes this as a throbbing stabbing severe headache with eye pain, unilateral right or left side, associated with nausea, photophobia and phonophobia. These headaches can last hours at a time. She is averaging 1-2 of the severe headaches per week. Typically she has to lay down in a dark quiet room and wait for symptoms to resolve. August 2015 patient was also having intermittent episodes of dizziness, clamminess, sweating, motion sickness. More recently she went to ENT for evaluation of these dizzy spells and was found to have no specific ear related pathology. She was advised to come to neurology for further evaluation of possible migraine phenomenon. Patient has family history of migraine in her maternal  uncle and aunt.   REVIEW OF SYSTEMS: Full 14 system review of systems performed and negative except:   Review of Systems  HENT: Positive for tinnitus.        Runny nose  Eyes: Positive for double vision and photophobia.  Gastrointestinal: Positive for constipation and diarrhea.  Musculoskeletal: Positive for back pain.  Neurological: Positive for dizziness.       Headache  Psychiatric/Behavioral: Positive for depression. The patient is nervous/anxious.        Hyperactive, decr concentration    ALLERGIES: No Known Allergies  HOME MEDICATIONS: Outpatient Medications Prior to Visit  Medication Sig Dispense Refill  . acetaminophen (TYLENOL) 325 MG tablet Take 650 mg by mouth every 6 (six) hours as needed.    . Marland Kitchenmphetamine-dextroamphetamine (ADDERALL XR) 30 MG 24 hr capsule Take 1 capsule (30 mg total) by mouth daily with breakfast. 30 capsule 0  . Aspirin-Acetaminophen-Caffeine (EXCEDRIN EXTRA STRENGTH PO) Take by mouth. As needed    . citalopram (CELEXA) 20 MG tablet Take 20 mg by mouth.    . fluticasone (FLONASE) 50 MCG/ACT nasal spray Place 1 spray into the nose.    . Marland KitchenUANFACINE HCL PO Take 1 tablet by mouth daily.    . Marland Kitchenbuprofen (ADVIL,MOTRIN) 600 MG tablet Take 1 tablet (600 mg total) by mouth 3 (three) times daily. 30 tablet 0  . topiramate (TOPAMAX) 50 MG tablet Take 1 tablet (50 mg total) by mouth 2 (two) times daily. 60 tablet 12  . sodium chloride (OCEAN) 0.65 % SOLN nasal spray Place 2 sprays into  both nostrils as needed for congestion. 1 Bottle 1   No facility-administered medications prior to visit.     PAST MEDICAL HISTORY: Past Medical History:  Diagnosis Date  . ADHD (attention deficit hyperactivity disorder)   . Allergy   . Depression   . Migraine     PAST SURGICAL HISTORY: Past Surgical History:  Procedure Laterality Date  . ADENOIDECTOMY  2001  . Base Wedge Osteotomy Right 02/27/2014   @ [place]  . OSTEOTOMY Right 02/27/2014   Met Head Rt #5 @ [facility]  .  TONSILLECTOMY  Age 19  . TYMPANOSTOMY TUBE PLACEMENT      FAMILY HISTORY: Family History  Problem Relation Age of Onset  . Bipolar disorder Mother   . Stroke Mother   . ADD / ADHD Father   . Depression Maternal Grandmother   . Hypertension Maternal Grandmother   . Hyperlipidemia Maternal Grandmother   . Heart disease Maternal Grandmother   . Diabetes Maternal Grandmother   . Arthritis Maternal Grandmother   . Skin cancer Maternal Grandmother   . Diabetes Maternal Grandfather   . Stroke Maternal Grandfather   . Colon cancer Maternal Grandfather   . Diabetes Paternal Grandmother   . Diabetes Paternal Grandfather   . Bipolar disorder Maternal Aunt   . Stroke Maternal Aunt     SOCIAL HISTORY:  Social History   Social History  . Marital status: Single    Spouse name: N/A  . Number of children: 0  . Years of education: Student   Occupational History  . Student Unemployed    [place] History Main Topics  . Smoking status: Passive Smoke Exposure - Never Smoker  . Smokeless tobacco: Never Used  . Alcohol use No  . Drug use: No  . Sexual activity: No   Other Topics Concern  . Not on file   Social History Narrative   Patient lives at home with her grandmother on the weekends. Lives in [place] at [place] during the week.   Caffeine Use: 1 cup daily      PHYSICAL EXAM  GENERAL EXAM/CONSTITUTIONAL: Vitals:  Vitals:   09/20/16 1323  BP: 105/68  Pulse: 77  Weight: 235 lb (106.6 kg)   Wt Readings from Last 3 Encounters:  09/20/16 235 lb (106.6 kg) (>99 %, Z > 2.33)*  07/07/16 239 lb 1.6 oz (108.5 kg) (>99 %, Z > 2.33)*  03/01/16 237 lb (107.5 kg) (>99 %, Z > 2.33)*   * Growth percentiles are based on CDC 2-20 Years data.   Body mass index is 36 kg/m. No exam data present  Patient is in no distress; well developed, nourished and groomed; neck is supple  CARDIOVASCULAR:  Examination of carotid arteries is normal; no carotid bruits  Regular rate and  rhythm, no murmurs  Examination of peripheral vascular system by observation and palpation is normal  EYES:  Ophthalmoscopic exam of optic discs and posterior segments is normal; no papilledema or hemorrhages  MUSCULOSKELETAL:  Gait, strength, tone, movements noted in Neurologic exam below  NEUROLOGIC: MENTAL STATUS:  No flowsheet data found.  awake, alert, oriented to person, place and time  recent and remote memory intact  normal attention and concentration  language fluent, comprehension intact, naming intact,   fund of knowledge appropriate  CRANIAL NERVE:   2nd - no papilledema on fundoscopic exam  2nd, 3rd, 4th, 6th - pupils equal and reactive to light, visual fields full to confrontation, extraocular muscles intact, no nystagmus  5th -  facial sensation symmetric  7th - facial strength symmetric  8th - hearing intact  9th - palate elevates symmetrically, uvula midline  11th - shoulder shrug symmetric  12th - tongue protrusion midline  MOTOR:   normal bulk and tone, full strength in the BUE, BLE  SENSORY:   normal and symmetric to light touch, temperature, vibration   COORDINATION:   finger-nose-finger, fine finger movements normal  REFLEXES:   deep tendon reflexes present and symmetric  GAIT/STATION:   narrow based gait; romberg is negative    DIAGNOSTIC DATA (LABS, IMAGING, TESTING) - I reviewed patient records, labs, notes, testing and imaging myself where available.  Lab Results  Component Value Date   WBC 6.6 12/08/2012   HGB 12.4 12/08/2012   HCT 35.6 12/08/2012   MCV 84.2 12/08/2012   PLT 304 12/08/2012      Component Value Date/Time   NA 137 12/08/2012 2222   K 3.6 12/08/2012 2222   CL 103 12/08/2012 2222   CO2 25 12/08/2012 2222   GLUCOSE 84 12/08/2012 2222   BUN 8 12/08/2012 2222   CREATININE 0.67 12/08/2012 2222   CALCIUM 9.1 12/08/2012 2222   PROT 7.1 12/08/2012 2222   ALBUMIN 3.8 12/08/2012 2222   AST 11  12/08/2012 2222   ALT 7 12/08/2012 2222   ALKPHOS 76 12/08/2012 2222   BILITOT 0.2 (L) 12/08/2012 2222   GFRNONAA NOT CALCULATED 12/08/2012 2222   GFRAA NOT CALCULATED 12/08/2012 2222   No results found for: CHOL, HDL, LDLCALC, LDLDIRECT, TRIG, CHOLHDL No results found for: HGBA1C No results found for: VITAMINB12 Lab Results  Component Value Date   TSH 1.030 12/10/2012    10/20/15 MRI brain [I reviewed images myself and agree with interpretation. -VRP]  - normal    ASSESSMENT AND PLAN  20 y.o. year old female here with 1-2 year history of mixed headaches with tension headaches and migraine without aura. Topiramate helping a little bit.    Dx:  Migraine without aura and without status migrainosus, not intractable    PLAN: I spent 15 minutes of face to face time with patient. Greater than 50% of time was spent in counseling and coordination of care with patient. In summary we discussed:   - continue topiramate 147m at bedtime or 542mtwice a day - if headaches worsen, may consider migraine rescue meds (patient wants to hold off for now) - continue to optimize nutrition, physical activity, stress mgmt and sleep  Meds ordered this encounter  Medications  . topiramate (TOPAMAX) 50 MG tablet    Sig: Take 1 tablet (50 mg total) by mouth 2 (two) times daily.    Dispense:  60 tablet    Refill:  12   Return in about 1 year (around 09/20/2017).    VIPenni BombardMD [id number]:34M Certified in Neurology, Neurophysiology and Neuroimaging  [facility] [address]SuFour CornersrDeatsville[place] [phone]

## 2016-09-22 ENCOUNTER — Ambulatory Visit: Payer: Medicaid Other | Admitting: Pediatrics

## 2016-11-21 ENCOUNTER — Ambulatory Visit: Payer: Medicaid Other | Admitting: Podiatry

## 2016-12-01 ENCOUNTER — Ambulatory Visit: Payer: Medicaid Other | Admitting: Podiatry

## 2016-12-16 ENCOUNTER — Encounter: Payer: Self-pay | Admitting: Podiatry

## 2016-12-16 ENCOUNTER — Ambulatory Visit (INDEPENDENT_AMBULATORY_CARE_PROVIDER_SITE_OTHER): Payer: Medicaid Other | Admitting: Podiatry

## 2016-12-16 ENCOUNTER — Ambulatory Visit (INDEPENDENT_AMBULATORY_CARE_PROVIDER_SITE_OTHER): Payer: Medicaid Other

## 2016-12-16 DIAGNOSIS — M2040 Other hammer toe(s) (acquired), unspecified foot: Secondary | ICD-10-CM

## 2016-12-16 DIAGNOSIS — Z9889 Other specified postprocedural states: Secondary | ICD-10-CM

## 2016-12-16 DIAGNOSIS — M205X1 Other deformities of toe(s) (acquired), right foot: Secondary | ICD-10-CM

## 2016-12-16 DIAGNOSIS — M79674 Pain in right toe(s): Secondary | ICD-10-CM

## 2016-12-19 NOTE — Progress Notes (Signed)
Subjective: 21 year old female presents the office today for concerns her right second toe pain. She states that she has started to have recurrence of the contracture to the right second toe which is painful with pressure in shoes. She has tried splinting without any significant improvement. She presents today for further evaluation. Denies any recent injury or trauma. Denies any systemic complaints such as fevers, chills, nausea, vomiting. No acute changes since last appointment, and no other complaints at this time.   Objective: AAO x3, NAD DP/PT pulses palpable bilaterally, CRT less than 3 seconds All incisions the prior surgery well-healed and she is having no issues to the other surgical sites. However the right second toe does appear to have a mallet toe contracture and there is a bulbous appearance to the medial and lateral aspects of the DIPJ of the second toe. There is no erythema or increase in warmth. There is no other areas of tenderness identified bilaterally. No open lesions or pre-ulcerative lesions.  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  -X-rays were obtained and reviewed. Contracture the toe is present. No evidence of acute fracture. -I discussed both conservative and surgical treatment options. We will start with pad and offloading of the area. I discussed with her revisional surgery. She'll likely undergo this later this spring when she gets out of school for the summer. -Follow-up next couple months or sooner if needed. Call any questions or concerns in the meantime.

## 2017-02-23 ENCOUNTER — Encounter (HOSPITAL_COMMUNITY): Payer: Self-pay | Admitting: Emergency Medicine

## 2017-02-23 ENCOUNTER — Ambulatory Visit (INDEPENDENT_AMBULATORY_CARE_PROVIDER_SITE_OTHER): Payer: Medicaid Other

## 2017-02-23 ENCOUNTER — Ambulatory Visit (HOSPITAL_COMMUNITY)
Admission: EM | Admit: 2017-02-23 | Discharge: 2017-02-23 | Disposition: A | Discharge status: Home / Self Care | Payer: Medicaid Other | Attending: Family Medicine | Admitting: Family Medicine

## 2017-02-23 DIAGNOSIS — M7652 Patellar tendinitis, left knee: Secondary | ICD-10-CM

## 2017-02-23 DIAGNOSIS — M249 Joint derangement, unspecified: Secondary | ICD-10-CM

## 2017-02-23 NOTE — ED Provider Notes (Signed)
CSN: [id number]     Arrival date & time 02/23/17  1351 History   None    Chief Complaint  Patient presents with  . Knee Pain   (Consider location/radiation/quality/duration/timing/severity/associated sxs/prior Treatment) 21 year old female mildly obese states that for 3 weeks she was having some bilateral knee discomfort and hearing some popping and grinding in both knees. Recently, she states she felt her knee pop out of place and then she created a maneuver that placed it back in line. She is complaining of bilateral knee pain again left greater than right. She states she has a history of a disease where she has loose ligaments and some of the major joints. She is ambulatory with full weightbearing.      Past Medical History:  Diagnosis Date  . ADHD (attention deficit hyperactivity disorder)   . Allergy   . Depression   . Migraine    Past Surgical History:  Procedure Laterality Date  . ADENOIDECTOMY  2001  . Base Wedge Osteotomy Right 02/27/2014   @ [place]  . OSTEOTOMY Right 02/27/2014   Met Head Rt #5 @ [facility]  . TONSILLECTOMY  Age 74  . TYMPANOSTOMY TUBE PLACEMENT     Family History  Problem Relation Age of Onset  . Bipolar disorder Mother   . Stroke Mother   . ADD / ADHD Father   . Depression Maternal Grandmother   . Hypertension Maternal Grandmother   . Hyperlipidemia Maternal Grandmother   . Heart disease Maternal Grandmother   . Diabetes Maternal Grandmother   . Arthritis Maternal Grandmother   . Skin cancer Maternal Grandmother   . Diabetes Maternal Grandfather   . Stroke Maternal Grandfather   . Colon cancer Maternal Grandfather   . Diabetes Paternal Grandmother   . Diabetes Paternal Grandfather   . Bipolar disorder Maternal Aunt   . Stroke Maternal Aunt    Social History  Substance Use Topics  . Smoking status: Passive Smoke Exposure - Never Smoker  . Smokeless tobacco: Never Used  . Alcohol use No   OB History    No data available     Review of Systems   Constitutional: Negative for activity change, chills and fever.  HENT: Negative.   Respiratory: Negative.   Cardiovascular: Negative.   Musculoskeletal:       As per HPI  Skin: Negative for color change, pallor and rash.  Neurological: Negative.   All other systems reviewed and are negative.   Allergies  Patient has no known allergies.  Home Medications   Prior to Admission medications   Medication Sig Start Date End Date Taking? Authorizing Provider  amphetamine-dextroamphetamine (ADDERALL XR) 10 MG 24 hr capsule Take 10 mg by mouth daily. Take between noon and 2 pm daily   Yes Historical Provider, MD  amphetamine-dextroamphetamine (ADDERALL XR) 30 MG 24 hr capsule Take 1 capsule (30 mg total) by mouth daily with breakfast. 09/22/14  Yes Marcha Solders, MD  citalopram (CELEXA) 20 MG tablet Take 20 mg by mouth.   Yes Historical Provider, MD  GUANFACINE HCL PO Take 1 tablet by mouth daily.   Yes Historical Provider, MD  topiramate (TOPAMAX) 50 MG tablet Take 1 tablet (50 mg total) by mouth 2 (two) times daily. 09/20/16  Yes Penni Bombard, MD  acetaminophen (TYLENOL) 325 MG tablet Take 650 mg by mouth every 6 (six) hours as needed.    Historical Provider, MD  Aspirin-Acetaminophen-Caffeine (EXCEDRIN EXTRA STRENGTH PO) Take by mouth. As needed    Historical Provider,  MD  fluticasone (FLONASE) 50 MCG/ACT nasal spray Place 1 spray into the nose. 06/11/14   Historical Provider, MD  ibuprofen (ADVIL,MOTRIN) 600 MG tablet Take 1 tablet (600 mg total) by mouth 3 (three) times daily. 04/18/16   Melony Overly, MD  sodium chloride (OCEAN) 0.65 % SOLN nasal spray Place 2 sprays into both nostrils as needed for congestion. 12/22/15 01/19/16  Leveda Anna, NP   Meds Ordered and Administered this Visit  Medications - No data to display  BP 119/71 (BP Location: Left Arm)   Pulse 72   Temp 98.6 F (37 C) (Oral)   Resp 16   SpO2 100%  No data found.   Physical Exam  Constitutional: She is  oriented to person, place, and time. She appears well-developed and well-nourished. No distress.  HENT:  Head: Normocephalic and atraumatic.  Eyes: EOM are normal.  Neck: Normal range of motion. Neck supple.  Pulmonary/Chest: Effort normal.  Musculoskeletal:  Bilateral knees with no apparent deformity, swelling or discoloration. Examination left knee reveals  tenderness over the patellar ligament supra and infra aspect. No swelling, no discoloration. Flexion is complete and extension is to 180. Negative varus, negative valgus, negative drawer. Rotation internally and externally does not produce pain in the knee. Patella glides within tract.No laxity appreciated. No abnormalities on physical exam.  Neurological: She is alert and oriented to person, place, and time. No cranial nerve deficit.  Skin: Skin is warm and dry.  Nursing note and vitals reviewed.   Urgent Care Course     Procedures (including critical care time)  Labs Review Labs Reviewed - No data to display  Imaging Review Dg Knee Complete 4 Views Left  Result Date: 02/23/2017 CLINICAL DATA:  Three weeks of left knee pain after rotation injury. Initial encounter. EXAM: LEFT KNEE - COMPLETE 4+ VIEW COMPARISON:  None. FINDINGS: No evidence of fracture, dislocation, or joint effusion. No evidence of arthropathy or other focal bone abnormality. Soft tissues are unremarkable. IMPRESSION: Negative. Electronically Signed   By: Monte Fantasia M.D.   On: 02/23/2017 14:59     Visual Acuity Review  Right Eye Distance:   Left Eye Distance:   Bilateral Distance:    Right Eye Near:   Left Eye Near:    Bilateral Near:         MDM   1. Knee joint hypermobility   2. Patellar tendinitis of left knee    Wear the knee brace while you are walking. Remove it periodically and perform some of the flexion and extension movements that were demonstrated in the urgent care and that you have learned in the past. For the front of the knee  where you have patella tendinitis apply ice periodically. Follow-up with the orthopedic listed above. He is a sport specialist as well.     Janne Napoleon, NP 02/23/17 1534

## 2017-02-23 NOTE — Discharge Instructions (Signed)
Wear the knee brace while you are walking. Remove it periodically and perform some of the flexion and extension movements that were demonstrated in the urgent care and that you have learned in the past. For the front of the knee where you have patella tendinitis apply ice periodically. Follow-up with the orthopedic listed above. He is a sport specialist as well.

## 2017-02-23 NOTE — ED Triage Notes (Signed)
Here for left knee pain onset 3 weeks ++ ... States last Friday, she twisted her knee while at work and felt a pop  Steady gait.... A&O x4... NAD

## 2017-02-23 NOTE — ED Notes (Signed)
Patient requesting multiple x-rays, notified provider of patient's request

## 2017-02-24 ENCOUNTER — Ambulatory Visit: Payer: Medicaid Other | Admitting: Podiatry

## 2017-03-03 ENCOUNTER — Ambulatory Visit (INDEPENDENT_AMBULATORY_CARE_PROVIDER_SITE_OTHER): Payer: Medicaid Other | Admitting: Podiatry

## 2017-03-03 DIAGNOSIS — M2041 Other hammer toe(s) (acquired), right foot: Secondary | ICD-10-CM | POA: Diagnosis not present

## 2017-03-03 NOTE — Patient Instructions (Signed)

## 2017-03-06 ENCOUNTER — Ambulatory Visit (INDEPENDENT_AMBULATORY_CARE_PROVIDER_SITE_OTHER): Payer: Medicaid Other | Admitting: Physician Assistant

## 2017-03-06 NOTE — Progress Notes (Signed)
Subjective: 21 year old female presents the office today for concerns of continued right second toe pain. She states that she has tried to change shoes as well as padding to offload the area without any significant improvement but she still gets pain with distal aspect of the right second toe. At this time should discuss surgical intervention. Denies any systemic complaints such as fevers, chills, nausea, vomiting. No acute changes since last appointment, and no other complaints at this time.   Objective: AAO x3, NAD DP/PT pulses palpable bilaterally, CRT less than 3 seconds All incisions the prior surgery well-healed and she is having no issues to the other surgical sites. Mallet toe deformity of the right second toe there is a bulbous appearance to the DIPJ. There is no erythema or increase in warmth. There is no ascending cellulitis. There is no fluctuance, crepitus there is no open sore. Tenderness the distal aspect second toe both on the dorsal DIPJ and distal digit. No other areas of tenderness identified bilaterally. No open lesions or pre-ulcerative lesions.  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  -I again discussed both conservative and surgical treatment options. This time she wishes to pursue a surgical intervention. Discussed the DIPJ arthroplasty with K wire fixation likely. -The incision placement as well as the postoperative course was discussed with the patient. I discussed risks of the surgery which include, but not limited to, infection, bleeding, pain, swelling, need for further surgery, delayed or nonhealing, painful or ugly scar, numbness or sensation changes, over/under correction, recurrence, transfer lesions, further deformity, hardware failure, DVT/PE, loss of toe/foot. Patient understands  these risks and wishes to proceed with surgery. The surgical consent was reviewed with the patient all 3 pages were signed. No promises or guarantees were given to the outcome of the procedure. All questions were answered to the best of my ability. Before the surgery the patient was encouraged to call the office if there is any further questions. The surgery will be performed at the [facility] on an outpatient basis.  Celesta Gentile, DPM

## 2017-03-20 ENCOUNTER — Ambulatory Visit (INDEPENDENT_AMBULATORY_CARE_PROVIDER_SITE_OTHER): Payer: Medicaid Other | Admitting: Physician Assistant

## 2017-03-20 ENCOUNTER — Encounter (INDEPENDENT_AMBULATORY_CARE_PROVIDER_SITE_OTHER): Payer: Self-pay | Admitting: Physician Assistant

## 2017-03-20 VITALS — BP 107/70 | HR 77 | Temp 98.7°F | Ht 68.5 in | Wt 229.6 lb

## 2017-03-20 DIAGNOSIS — M25562 Pain in left knee: Secondary | ICD-10-CM

## 2017-03-20 DIAGNOSIS — M25561 Pain in right knee: Secondary | ICD-10-CM

## 2017-03-20 DIAGNOSIS — L299 Pruritus, unspecified: Secondary | ICD-10-CM

## 2017-03-20 DIAGNOSIS — G8929 Other chronic pain: Secondary | ICD-10-CM | POA: Diagnosis not present

## 2017-03-20 DIAGNOSIS — K30 Functional dyspepsia: Secondary | ICD-10-CM

## 2017-03-20 DIAGNOSIS — S83005A Unspecified dislocation of left patella, initial encounter: Secondary | ICD-10-CM | POA: Diagnosis not present

## 2017-03-20 MED ORDER — RANITIDINE HCL 75 MG PO TABS: 75.0000 mg | ORAL_TABLET | Freq: Two times a day (BID) | ORAL | 0 refills | Status: DC

## 2017-03-20 MED ORDER — MELOXICAM 15 MG PO TABS: 15.0000 mg | ORAL_TABLET | Freq: Every day | ORAL | 0 refills | Status: DC

## 2017-03-20 NOTE — Progress Notes (Signed)
Subjective:  Patient ID: Hannah Palmer, female    DOB: [birth year]  Age: 21 y.o. MRN: [id number]  CC: rash on scalp  HPI Hannah Palmer is a 21 y.o. female with a PMH of depression, ADHD, and migraines presents for a rash on scalp. Red patches on scalp to come and go since a few months ago. The patches are itchy. Uses head and shoulders but is not sure if this works. Does not have any lesions now.     Also wants to address Urgent care visit of 02/23/17. Was diagnosed with Knee joint hypermobility and patellar tendonitis. XR left knee was negative. No prescription made at Urgent care per patient. Pt has previously been to physical therapy for the same. Has had several sessions over the years since approximately 9 years ago. Had some progress with PT but the patella seems to dislocate very easily even with minimal to no exertion. Does not endorse any other symptoms.    ROS Review of Systems  Constitutional: Negative for chills, fever and malaise/fatigue.  Eyes: Negative for blurred vision.  Respiratory: Negative for shortness of breath.   Cardiovascular: Negative for chest pain and palpitations.  Gastrointestinal: Negative for abdominal pain and nausea.  Genitourinary: Negative for dysuria and hematuria.  Musculoskeletal: Positive for joint pain. Negative for myalgias.  Skin: Positive for itching and rash.  Neurological: Negative for tingling and headaches.  Psychiatric/Behavioral: Negative for depression. The patient is not nervous/anxious.     Objective:  BP 107/70 (BP Location: Right Arm, Patient Position: Sitting, Cuff Size: Large)   Pulse 77   Temp 98.7 F (37.1 C) (Oral)   Ht 5' 8.5" (1.74 m)   Wt 229 lb 9.6 oz (104.1 kg)   LMP  (Exact Date)   SpO2 96%   BMI 34.40 kg/m   BP/Weight 03/20/2017 02/23/2017 17/04/1606  Systolic BP 371 062 694  Diastolic BP 70 71 68  Wt. (Lbs) 229.6 - 235  BMI 34.4 - 36  Some encounter information is confidential and restricted. Go to  Review Flowsheets activity to see all data.      Physical Exam  Constitutional: She is oriented to person, place, and time.  Well developed, well nourished, NAD, polite  HENT:  Head: Normocephalic and atraumatic.  Eyes: No scleral icterus.  Neck: Normal range of motion. Neck supple. No thyromegaly present.  Cardiovascular: Normal rate, regular rhythm and normal heart sounds.   Pulmonary/Chest: Effort normal and breath sounds normal.  Abdominal: Soft. Bowel sounds are normal. There is no tenderness.  Musculoskeletal: She exhibits no edema.  Left patella not dislocated. Patellar apprehension negative. Varus and valgus stress testing negative. Anterior/posterior drawer negative. No midline joint tenderness.  Neurological: She is alert and oriented to person, place, and time.  Skin: Skin is warm and dry. No rash noted. No erythema. No pallor.  Psychiatric: She has a normal mood and affect. Her behavior is normal. Thought content normal.  Vitals reviewed.    Assessment & Plan:   1. Chronic pain of both knees - Begin meloxicam (MOBIC) 15 MG tablet; Take 1 tablet (15 mg total) by mouth daily.  Dispense: 30 tablet; Refill: 0  2. Acid indigestion - Begin ranitidine (ZANTAC 75) 75 MG tablet; Take 1 tablet (75 mg total) by mouth 2 (two) times daily.  Dispense: 30 tablet; Refill: 0  3. Dislocation of left patella, initial encounter - AMB referral to orthopedics - Per pt, has had 3 previous complete PT regimens but continues with patellar dislocation.  4. Itchy scalp - No redness or itching today. - Return if redness or itching occur. - Suspected anti-fungal but none will be prescribed due to citalopram use and the risk of QT prolongation or Serotonin Syndrome.   Meds ordered this encounter  Medications  . meloxicam (MOBIC) 15 MG tablet    Sig: Take 1 tablet (15 mg total) by mouth daily.    Dispense:  30 tablet    Refill:  0    Order Specific Question:   Supervising Provider     Answer:   Tresa Garter [id number]  . ranitidine (ZANTAC 75) 75 MG tablet    Sig: Take 1 tablet (75 mg total) by mouth 2 (two) times daily.    Dispense:  30 tablet    Refill:  0    Order Specific Question:   Supervising Provider    Answer:   Tresa Garter [id number]    Follow-up: Return in about 4 weeks (around 04/17/2017) for knee pain.   Clent Demark PA

## 2017-03-20 NOTE — Patient Instructions (Signed)
Knee Pain, Adult Knee pain in adults is common. It can be caused by many things, including:  Arthritis.  A fluid-filled sac (cyst) or growth in your knee.  An infection in your knee.  An injury that will not heal.  Damage, swelling, or irritation of the tissues that support your knee. Knee pain is usually not a sign of a serious problem. The pain may go away on its own with time and rest. If it does not, a health care provider may order tests to find the cause of the pain. These may include:  Imaging tests, such as an X-ray, MRI, or ultrasound.  Joint aspiration. In this test, fluid is removed from the knee.  Arthroscopy. In this test, a lighted tube is inserted into knee and an image is projected onto a TV screen.  A biopsy. In this test, a sample of tissue is removed from the body and studied under a microscope. Follow these instructions at home: Pay attention to any changes in your symptoms. Take these actions to relieve your pain. Activity   Rest your knee.  Do not do things that cause pain or make pain worse.  Avoid high-impact activities or exercises, such as running, jumping rope, or doing jumping jacks. General instructions   Take over-the-counter and prescription medicines only as told by your health care provider.  Raise (elevate) your knee above the level of your heart when you are sitting or lying down.  Sleep with a pillow under your knee.  If directed, apply ice to the knee:  Put ice in a plastic bag.  Place a towel between your skin and the bag.  Leave the ice on for 20 minutes, 2-3 times a day.  Ask your health care provider if you should wear an elastic knee support.  Lose weight if you are overweight. Extra weight can put pressure on your knee.  Do not use any products that contain nicotine or tobacco, such as cigarettes and e-cigarettes. Smoking may slow the healing of any bone and joint problems that you may have. If you need help quitting, ask  your health care provider. Contact a health care provider if:  Your knee pain continues, changes, or gets worse.  You have a fever along with knee pain.  Your knee buckles or locks up.  Your knee swells, and the swelling becomes worse. Get help right away if:  Your knee feels warm to the touch.  You cannot move your knee.  You have severe pain in your knee.  You have chest pain.  You have trouble breathing. Summary  Knee pain in adults is common. It can be caused by many things, including, arthritis, infection, cysts, or injury.  Knee pain is usually not a sign of a serious problem, but if it does not go away, a health care provider may perform tests to know the cause of the pain.  Pay attention to any changes in your symptoms. Relieve your pain with rest, medicines, light activity, and use of ice.  Get help if your pain continues or becomes very severe, or if your knee buckles or locks up, or if you have chest pain or trouble breathing. This information is not intended to replace advice given to you by your health care provider. Make sure you discuss any questions you have with your health care provider. Document Released: 08/28/2007 Document Revised: 10/21/2016 Document Reviewed: 10/21/2016 Elsevier Interactive Patient Education  2017 [employer].

## 2017-03-23 ENCOUNTER — Ambulatory Visit (INDEPENDENT_AMBULATORY_CARE_PROVIDER_SITE_OTHER): Payer: Medicaid Other | Admitting: Orthopedic Surgery

## 2017-03-23 ENCOUNTER — Encounter (INDEPENDENT_AMBULATORY_CARE_PROVIDER_SITE_OTHER): Payer: Self-pay | Admitting: Orthopedic Surgery

## 2017-03-23 ENCOUNTER — Ambulatory Visit (INDEPENDENT_AMBULATORY_CARE_PROVIDER_SITE_OTHER): Payer: Medicaid Other

## 2017-03-23 VITALS — Ht 68.0 in | Wt 229.0 lb

## 2017-03-23 DIAGNOSIS — M2242 Chondromalacia patellae, left knee: Secondary | ICD-10-CM

## 2017-03-23 DIAGNOSIS — G8929 Other chronic pain: Secondary | ICD-10-CM | POA: Diagnosis not present

## 2017-03-23 DIAGNOSIS — M25562 Pain in left knee: Secondary | ICD-10-CM | POA: Diagnosis not present

## 2017-03-23 NOTE — Addendum Note (Signed)
Addended by: Maxcine Ham on: 03/23/2017 10:04 AM   Modules accepted: Orders

## 2017-03-23 NOTE — Progress Notes (Signed)
Office Visit Note   Patient: Hannah Palmer           Date of Birth: 07-25-1996           MRN: [id number] Visit Date: 03/23/2017              Requested by: Clent Demark, PA-C [place], [place] [address] PCP: Clent Demark, PA-C  Chief Complaint  Patient presents with  . Right Knee - Pain  . Left Knee - Pain      HPI: Patient is a 21 year old woman has been having pain in the patellofemoral joint both knees left worse than right. Patient states that her patella pops out of place and then pops back in she states this has been going on and off for 9 years. She states she has difficulty getting from a sitting to a standing position she complains of popping and grinding in the patellofemoral joint.  Assessment & Plan: Visit Diagnoses:  1. Chronic pain of left knee   2. Chondromalacia patellae, left knee     Plan: Patient was given instructions for quad isometrics and VMO strengthening she is also given a prescription for physical therapy follow-up in the office as needed.  Follow-Up Instructions: Return if symptoms worsen or fail to improve.   Ortho Exam  Patient is alert, oriented, no adenopathy, well-dressed, normal affect, normal respiratory effort. Examination patient has a normal gait. Examination the left knee she has hyperextension left knee more than the right knee with valgus alignment to both knees she has hypermobility of the patella on the left more than the right she is tender to palpation of the medial lateral facets of the patella bilaterally. I cannot completely dislocate her patella but she has more subluxation of patella on the left compared to the right. She has quadriceps  atrophy.  Imaging: Xr Knee 1-2 Views Left  Result Date: 03/23/2017 Two-view radiographs of the left knee shows lateral alignment of the patella from both knees with no evidence of any bony spurs in the lateral view no subcondylar sclerosis.   Labs: Lab  Results  Component Value Date   REPTSTATUS 10/13/2013 FINAL 10/11/2013   CULT  10/11/2013    ESCHERICHIA COLI Performed at [facility] ISOLATED 12/02/2015    Orders:  Orders Placed This Encounter  Procedures  . XR Knee 1-2 Views Left   No orders of the defined types were placed in this encounter.    Procedures: No procedures performed  Clinical Data: No additional findings.  ROS:  All other systems negative, except as noted in the HPI. Review of Systems  Objective: Vital Signs: Ht '5\' 8"'  (1.727 m)   Wt 229 lb (103.9 kg)   BMI 34.82 kg/m   Specialty Comments:  No specialty comments available.  PMFS History: Patient Active Problem List   Diagnosis Date Noted  . Encounter for vision screening 07/07/2016  . 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 Frequency of urination 12/03/2015  . Screening 12/03/2015  . Migraine without aura and without status migrainosus, not intractable 12/03/2015  . Hammer toe 11/03/2015  . Epigastric pain 08/25/2014  . ADHD (attention deficit hyperactivity disorder) 07/03/2013   Past Medical History:  Diagnosis Date  . ADHD (attention deficit hyperactivity disorder)   . Allergy   . Depression   . Migraine     Family History  Problem Relation Age of Onset  . Bipolar disorder Mother   .  Stroke Mother   . ADD / ADHD Father   . Depression Maternal Grandmother   . Hypertension Maternal Grandmother   . Hyperlipidemia Maternal Grandmother   . Heart disease Maternal Grandmother   . Diabetes Maternal Grandmother   . Arthritis Maternal Grandmother   . Skin cancer Maternal Grandmother   . Diabetes Maternal Grandfather   . Stroke Maternal Grandfather   . Colon cancer Maternal Grandfather   . Diabetes Paternal Grandmother   . Diabetes Paternal Grandfather   . Bipolar disorder Maternal Aunt   . Stroke Maternal Aunt     Past Surgical History:  Procedure Laterality Date  . ADENOIDECTOMY   2001  . Base Wedge Osteotomy Right 02/27/2014   @ [place]  . OSTEOTOMY Right 02/27/2014   Met Head Rt #5 @ [facility]  . TONSILLECTOMY  Age 7  . TYMPANOSTOMY TUBE PLACEMENT     Social History   Occupational History  . Student Unemployed    [place] History Main Topics  . Smoking status: Passive Smoke Exposure - Never Smoker  . Smokeless tobacco: Never Used  . Alcohol use No  . Drug use: No  . Sexual activity: No

## 2017-03-24 ENCOUNTER — Telehealth: Payer: Self-pay | Admitting: Podiatry

## 2017-03-24 NOTE — Telephone Encounter (Signed)
Pt called stating she needs to r/s her surgery for Wednesday. Please call asap to r/s

## 2017-03-27 NOTE — Telephone Encounter (Signed)
I attempted to call the patient.  Her grandmother stated she was at work.  She said she would get her to give me a call first thing in the morning.

## 2017-03-29 ENCOUNTER — Telehealth: Payer: Self-pay | Admitting: *Deleted

## 2017-03-29 NOTE — Telephone Encounter (Signed)
"  I had surgery scheduled for tomorrow with Dr. Jacqualyn Posey.  I need to reschedule it.  I missed your call.  Call me back please."

## 2017-04-03 ENCOUNTER — Encounter: Payer: Medicaid Other | Admitting: Podiatry

## 2017-04-04 NOTE — Telephone Encounter (Addendum)
I'm returning your call.  How can I help you?  "I want to reschedule my surgery.  Is it okay to schedule it the first of August?"  Yes, Dr. Jacqualyn Posey can do it on August 1 or 8.  "Let's do it on August 1.  Will I need to register again through the passport portal?"  I will get it scheduled.  You do not have to register again.  I called Caren Griffins at [facility] and rescheduled patient's surgery to 06/14/2017.

## 2017-04-13 ENCOUNTER — Encounter: Payer: Medicaid Other | Admitting: Podiatry

## 2017-04-17 ENCOUNTER — Encounter (INDEPENDENT_AMBULATORY_CARE_PROVIDER_SITE_OTHER): Payer: Self-pay | Admitting: Physician Assistant

## 2017-04-17 ENCOUNTER — Ambulatory Visit (INDEPENDENT_AMBULATORY_CARE_PROVIDER_SITE_OTHER): Payer: Medicaid Other | Admitting: Physician Assistant

## 2017-04-17 VITALS — BP 105/70 | HR 63 | Temp 98.4°F | Wt 233.4 lb

## 2017-04-17 DIAGNOSIS — S83005D Unspecified dislocation of left patella, subsequent encounter: Secondary | ICD-10-CM

## 2017-04-17 DIAGNOSIS — M778 Other enthesopathies, not elsewhere classified: Secondary | ICD-10-CM | POA: Diagnosis not present

## 2017-04-17 DIAGNOSIS — L299 Pruritus, unspecified: Secondary | ICD-10-CM

## 2017-04-17 MED ORDER — WRIST SPLINT/COCK-UP/LEFT M MISC: 1.0000 "application " | Freq: Every day | 0 refills | Status: AC

## 2017-04-17 MED ORDER — CLOTRIMAZOLE 1 % EX OINT: 1.0000 "application " | TOPICAL_OINTMENT | Freq: Every day | CUTANEOUS | 0 refills | Status: DC

## 2017-04-17 MED ORDER — DICLOFENAC POTASSIUM 50 MG PO TABS: 50.0000 mg | ORAL_TABLET | Freq: Three times a day (TID) | ORAL | 0 refills | Status: DC

## 2017-04-17 MED ORDER — ACETAMINOPHEN-CODEINE #3 300-30 MG PO TABS: 1.0000 | ORAL_TABLET | Freq: Three times a day (TID) | ORAL | 0 refills | Status: AC | PRN

## 2017-04-17 NOTE — Patient Instructions (Signed)
Wear wrist splint and limit bending of the left wrist for two weeks. Take anti-inflammatory as directed.   Tendinitis Tendinitis is inflammation of a tendon. A tendon is a strong cord of tissue that connects muscle to bone. Tendinitis can affect any tendon, but it most commonly affects the shoulder tendon (rotator cuff), ankle tendon (Achilles tendon), elbow tendon (triceps tendon), or one of the tendons in the wrist. What are the causes? This condition may be caused by:  Overusing a tendon or muscle. This is common.  Age-related wear and tear.  Injury.  Inflammatory conditions, such as arthritis.  Certain medicines.  What increases the risk? This condition is more likely to develop in people who do activities that involve repetitive motions. What are the signs or symptoms? Symptoms of this condition may include:  Pain.  Tenderness.  Mild swelling.  How is this diagnosed? This condition is diagnosed with a physical exam. You may also have tests, such as:  Ultrasound. This uses sound waves to make an image of your affected area.  MRI.  How is this treated? This condition may be treated by resting, icing, applying pressure (compression), and raising (elevating) the area above the level of your heart. This is known as RICE therapy. Treatment may also include:  Medicines to help reduce inflammation or to help reduce pain.  Exercises or physical therapy to strengthen and stretch the tendon.  A brace or splint.  Surgery (rare).  Follow these instructions at home:  If you have a splint or brace:  Wear the splint or brace as told by your health care provider. Remove it only as told by your health care provider.  Loosen the splint or brace if your fingers or toes tingle, become numb, or turn cold and blue.  Do not take baths, swim, or use a hot tub until your health care provider approves. Ask your health care provider if you can take showers. You may only be allowed to  take sponge baths for bathing.  Do not let your splint or brace get wet if it is not waterproof. ? If your splint or brace is not waterproof, cover it with a watertight plastic bag when you take a bath or a shower.  Keep the splint or brace clean. Managing pain, stiffness, and swelling  If directed, apply ice to the affected area. ? Put ice in a plastic bag. ? Place a towel between your skin and the bag. ? Leave the ice on for 20 minutes, 2-3 times a day.  If directed, apply heat to the affected area as often as told by your health care provider. Use the heat source that your health care provider recommends, such as a moist heat pack or a heating pad. ? Place a towel between your skin and the heat source. ? Leave the heat on for 20-30 minutes. ? Remove the heat if your skin turns bright red. This is especially important if you are unable to feel pain, heat, or cold. You may have a greater risk of getting burned.  Move the fingers or toes of the affected limb often, if this applies. This can help to prevent stiffness and lessen swelling.  If directed, elevate the affected area above the level of your heart while you are sitting or lying down. Driving  Do not drive or operate heavy machinery while taking prescription pain medicine.  Ask your health care provider when it is safe to drive if you have a splint or brace on any  part of your arm or leg. Activity  Return to your normal activities as told by your health care provider. Ask your health care provider what activities are safe for you.  Rest the affected area as told by your health care provider.  Avoid using the affected area while you are experiencing symptoms of tendinitis.  Do exercises as told by your health care provider. General instructions  If you have a splint, do not put pressure on any part of the splint until it is fully hardened. This may take several hours.  Wear an elastic bandage or compression wrap only as  told by your health care provider.  Take over-the-counter and prescription medicines only as told by your health care provider.  Keep all follow-up visits as told by your health care provider. This is important. Contact a health care provider if:  Your symptoms do not improve.  You develop new, unexplained problems, such as numbness in your hands. This information is not intended to replace advice given to you by your health care provider. Make sure you discuss any questions you have with your health care provider. Document Released: 10/28/2000 Document Revised: 06/30/2016 Document Reviewed: 08/03/2015 Elsevier Interactive Patient Education  [employer].

## 2017-04-17 NOTE — Progress Notes (Signed)
Subjective:  Patient ID: Hannah Palmer, female    DOB: [birth year]  Age: 21 y.o. MRN: [id number]  CC: f/u knee pain. New onset wrist pain  HPI Hannah Palmer is a 21 y.o. female with a PMH of patellar dislocation and depression with anxiety presents to f/u on left knee pain and itchy scalp. Also has new onset left wrist pain.     Patient states her knee pain is persistent. Patella has dislocated since the last visit. Went to orthopedics and was advised to conduct physical therapy.  Has not made PT appointment yet. Current left knee pain is 6/10.    Has left wrist pain attributed to sleeping on her bent wrist. Onset 4-5 days ago. Sharp pain located on the dorsum of left wrist with general movement. Has not used a splint for relief.    Says her itchy scalp persists off and on. Associated flaking from scalp and right ear canal. Uses Selson Blue to little effect. Scalp is not itchy and red today but says she took a picture of what it looks look when inflamed. Brings a sample of flaking from her ear. Does not endorse any other symptoms.     Outpatient Medications Prior to Visit  Medication Sig Dispense Refill  . acetaminophen (TYLENOL) 325 MG tablet Take 650 mg by mouth every 6 (six) hours as needed.    Marland Kitchen amphetamine-dextroamphetamine (ADDERALL XR) 10 MG 24 hr capsule Take 10 mg by mouth daily. Take between noon and 2 pm daily    . amphetamine-dextroamphetamine (ADDERALL XR) 30 MG 24 hr capsule Take 1 capsule (30 mg total) by mouth daily with breakfast. 30 capsule 0  . Aspirin-Acetaminophen-Caffeine (EXCEDRIN EXTRA STRENGTH PO) Take by mouth. As needed    . citalopram (CELEXA) 20 MG tablet Take 20 mg by mouth.    . fluticasone (FLONASE) 50 MCG/ACT nasal spray Place 1 spray into the nose.    Marland Kitchen GUANFACINE HCL PO Take 1 tablet by mouth daily.    . ranitidine (ZANTAC 75) 75 MG tablet Take 1 tablet (75 mg total) by mouth 2 (two) times daily. 30 tablet 0  . sodium chloride (OCEAN) 0.65 % SOLN  nasal spray Place 2 sprays into both nostrils as needed for congestion. 1 Bottle 1  . topiramate (TOPAMAX) 50 MG tablet Take 1 tablet (50 mg total) by mouth 2 (two) times daily. 60 tablet 12  . ibuprofen (ADVIL,MOTRIN) 600 MG tablet Take 1 tablet (600 mg total) by mouth 3 (three) times daily. 30 tablet 0  . meloxicam (MOBIC) 15 MG tablet Take 1 tablet (15 mg total) by mouth daily. 30 tablet 0   No facility-administered medications prior to visit.      ROS Review of Systems  Constitutional: Negative for chills, fever and malaise/fatigue.  Eyes: Negative for blurred vision.  Respiratory: Negative for shortness of breath.   Cardiovascular: Negative for chest pain and palpitations.  Gastrointestinal: Negative for abdominal pain and nausea.  Genitourinary: Negative for dysuria and hematuria.  Musculoskeletal: Positive for joint pain. Negative for myalgias.  Skin: Positive for itching. Negative for rash.  Neurological: Negative for tingling and headaches.  Psychiatric/Behavioral: Negative for depression. The patient is not nervous/anxious.     Objective:  BP 105/70 (BP Location: Right Arm, Patient Position: Sitting, Cuff Size: Large)   Pulse 63   Temp 98.4 F (36.9 C) (Oral)   Wt 233 lb 6.4 oz (105.9 kg)   SpO2 98%   BMI 35.49 kg/m   BP/Weight 04/17/2017  0/62/6948 03/17/6269  Systolic BP 350 - 093  Diastolic BP 70 - 70  Wt. (Lbs) 233.4 229 229.6  BMI 35.49 34.82 34.4  Some encounter information is confidential and restricted. Go to Review Flowsheets activity to see all data.      Physical Exam  Constitutional: She is oriented to person, place, and time.  Well developed, obese, NAD, polite  HENT:  Head: Normocephalic and atraumatic.  Eyes: No scleral icterus.  Cardiovascular: Normal rate, regular rhythm and normal heart sounds.   Pulmonary/Chest: Effort normal and breath sounds normal.  Musculoskeletal: She exhibits no edema.  Mild to moderate TTP of the dorsum of the left  wrist. No deformity, erythema, ecchymosis, edema, or limited aROM of the left wrist.  Neurological: She is alert and oriented to person, place, and time. No cranial nerve deficit. Coordination normal.  Skin: Skin is warm and dry. No rash noted. No erythema. No pallor.  Minimal dandruff noted, no erythema, plaques, scaling, or crusting of the scalp or ears.  Psychiatric: She has a normal mood and affect. Her behavior is normal. Thought content normal.  Vitals reviewed.    Assessment & Plan:   1. Dislocation of left patella, subsequent encounter - Begin acetaminophen-codeine (TYLENOL #3) 300-30 MG tablet; Take 1 tablet by mouth every 8 (eight) hours as needed for moderate pain.  Dispense: 42 tablet; Refill: 0  2. Wrist tendonitis - Begin diclofenac (CATAFLAM) 50 MG tablet; Take 1 tablet (50 mg total) by mouth 3 (three) times daily.  Dispense: 90 tablet; Refill: 0 - Begin Elastic Bandages & Supports (WRIST SPLINT/COCK-UP/LEFT M) MISC; 1 application by Does not apply route daily.  Dispense: 1 each; Refill: 0 - Begin acetaminophen-codeine (TYLENOL #3) 300-30 MG tablet; Take 1 tablet by mouth every 8 (eight) hours as needed for moderate pain.  Dispense: 42 tablet; Refill: 0 - Stop Meloxicam and Ibuprofen.   3. Scalp itch - Unknown etiology at this point as patient is yet again asymptomatic here in clinic. Will start a trial of topical antifungal. - Begin Clotrimazole 1 % OINT; Apply 1 application topically daily.  Dispense: 30 g; Refill: 0   Meds ordered this encounter  Medications  . diclofenac (CATAFLAM) 50 MG tablet    Sig: Take 1 tablet (50 mg total) by mouth 3 (three) times daily.    Dispense:  90 tablet    Refill:  0    Order Specific Question:   Supervising Provider    Answer:   Tresa Garter [id number]  . Clotrimazole 1 % OINT    Sig: Apply 1 application topically daily.    Dispense:  30 g    Refill:  0    Order Specific Question:   Supervising Provider    Answer:    Tresa Garter [id number]  . Elastic Bandages & Supports (WRIST SPLINT/COCK-UP/LEFT M) MISC    Sig: 1 application by Does not apply route daily.    Dispense:  1 each    Refill:  0    Order Specific Question:   Supervising Provider    Answer:   Tresa Garter [id number]  . acetaminophen-codeine (TYLENOL #3) 300-30 MG tablet    Sig: Take 1 tablet by mouth every 8 (eight) hours as needed for moderate pain.    Dispense:  42 tablet    Refill:  0    Order Specific Question:   Supervising Provider    Answer:   Tresa Garter [[id number]]    Follow-up: Return if symptoms  worsen or fail to improve.   Clent Demark PA

## 2017-04-18 ENCOUNTER — Other Ambulatory Visit (INDEPENDENT_AMBULATORY_CARE_PROVIDER_SITE_OTHER): Payer: Self-pay | Admitting: Physician Assistant

## 2017-04-18 DIAGNOSIS — L299 Pruritus, unspecified: Secondary | ICD-10-CM | POA: Insufficient documentation

## 2017-04-18 MED ORDER — CLOTRIMAZOLE 1 % EX CREA: 1.0000 "application " | TOPICAL_CREAM | Freq: Two times a day (BID) | CUTANEOUS | 0 refills | Status: DC

## 2017-04-18 NOTE — Progress Notes (Signed)
Clotrimazole ointment not available. Switched to cream.

## 2017-05-16 ENCOUNTER — Ambulatory Visit: Payer: Medicaid Other | Admitting: Physical Therapy

## 2017-05-29 ENCOUNTER — Ambulatory Visit: Payer: Medicaid Other | Attending: Sports Medicine | Admitting: Rehabilitative and Restorative Service Providers"

## 2017-05-29 ENCOUNTER — Encounter: Payer: Self-pay | Admitting: Rehabilitative and Restorative Service Providers"

## 2017-05-29 DIAGNOSIS — R293 Abnormal posture: Secondary | ICD-10-CM | POA: Insufficient documentation

## 2017-05-29 DIAGNOSIS — M25562 Pain in left knee: Secondary | ICD-10-CM | POA: Diagnosis present

## 2017-05-29 DIAGNOSIS — G8929 Other chronic pain: Secondary | ICD-10-CM | POA: Diagnosis present

## 2017-05-29 DIAGNOSIS — M546 Pain in thoracic spine: Secondary | ICD-10-CM | POA: Insufficient documentation

## 2017-05-29 DIAGNOSIS — M545 Low back pain, unspecified: Secondary | ICD-10-CM

## 2017-05-29 DIAGNOSIS — M6281 Muscle weakness (generalized): Secondary | ICD-10-CM

## 2017-05-29 NOTE — Therapy (Signed)
[facility] [address] [place], [place], [address] Phone: [phone]   Fax:  [phone]  Physical Therapy Evaluation  Patient Details  Name: Hannah Palmer MRN: [id number] Date of Birth: 04/09/1996 Referring Provider: Duda, Kendall  Encounter Date: 05/29/2017      PT End of Session - 05/29/17 1313    Visit Number 1   Number of Visits 12   Date for PT Re-Evaluation 07/10/17   Authorization Type MCD   PT Start Time 1152   PT Stop Time 1303   PT Time Calculation (min) 71 min   Activity Tolerance Patient tolerated treatment well;No increased pain   Behavior During Therapy WFL for tasks assessed/performed      Past Medical History:  Diagnosis Date  . ADHD (attention deficit hyperactivity disorder)   . Allergy   . Depression   . Migraine     Past Surgical History:  Procedure Laterality Date  . ADENOIDECTOMY  2001  . Base Wedge Osteotomy Right 02/27/2014   @ [facility]  . OSTEOTOMY Right 02/27/2014   Met Head Rt #5 @ [facility]  . TONSILLECTOMY  Age 15  . TYMPANOSTOMY TUBE PLACEMENT      There were no vitals filed for this visit.       Subjective Assessment - 05/29/17 1159    Subjective When I squat down, L knee causes issues bending down and getting back up. Back pain began in May 2018 and is getting progressively worse. Think my back is related to my posture.    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 L knee pops out without warning without any aggravating factor. R LBP/upper back began in May looking down at laptop in school with recent flareup when scrubbing the floor.    Limitations Sitting;Reading;Lifting;Standing;Walking;House hold activities   How long can you sit comfortably? 1 hour or more   How long can you stand comfortably? 1 hour or more   How long can you walk comfortably? 1 hour or more   Diagnostic tests 03/23/17 Xray of L knee shows lateral patella alignment   Patient Stated Goals to correct my posture   Currently in Pain? Yes   Pain Score 5    Pain Location Back   Pain Orientation Right;Left   Pain Descriptors / Indicators Sharp;Stabbing   Pain Type Chronic pain   Pain Onset More than a month ago   Pain Frequency Constant   Aggravating Factors  bending down, sitting in a sloped chair   Pain Relieving Factors stretched   Effect of Pain on Daily Activities does not hinder activities but pt does experience discomfort   Multiple Pain Sites Yes   Pain Score 3   Pain Location Knee   Pain Orientation Left   Pain Descriptors / Indicators Aching   Pain Type Chronic pain   Pain Onset More than a month ago   Pain Frequency Intermittent   Aggravating Factors  agility activities   Pain Relieving Factors rest            [facility] PT Assessment - 05/29/17 0001      Assessment   Medical Diagnosis thoracolumbar pain, L knee pain   Referring Provider Duda, Kendall   Onset Date/Surgical Date 03/14/17   Hand Dominance Left   Next MD Visit --  TBD   Prior Therapy none       Precautions   Precautions None     Restrictions   Weight Bearing Restrictions No     Balance Screen   Has the patient fallen in the past 6 months No     [place] residence     Prior Function   Level of Independence Independent     Cognition   Overall Cognitive Status Within Functional Limits for tasks assessed     Coordination   Gross Motor Movements are Fluid and Coordinated Yes   Fine Motor Movements are Fluid and Coordinated Yes     Posture/Postural Control   Posture Comments forward head, beginning of  thoracic hump, rounded shoulders bil, decreased lumbar lordosis     ROM / Strength   AROM / PROM / Strength AROM;Strength     AROM   Overall AROM Comments cervical AROM WNL and =; shoulder AROM  WNL and =; lumbar AROM WNL and full except bil rot 25% limited but without pain; knee AROM WNL     PROM   Overall PROM Comments hip PROM WNL and even     Strength   Overall Strength Comments L hip flex 4-, knee ext 4+, knee flex 4+, abduct 3+, adduct 4+; R LE strength 5/5 throughout; lumbar/core strength poor     Palpation   Patella mobility bil patella hypermobile R > L, L patella more laterally tracks with quad set   Spinal mobility no significant difference with spinal mobility; L thoracolumbar paraspinals tighter than R   SI assessment  level   Palpation comment L scapula heigher, L Upper Trap heigher,      Special Tests    Special Tests --  + SLR for Hamstring tightness, ITB test - L            Objective measurements completed on examination: See above findings.                  PT Education - 05/29/17 1303    Education provided Yes   Education Details HEP issued: SLR/VMO SLR/hip adduction in L sidelying all 10-20x 2x/day; decompression exercises 2x/day; education provided in sitting/standing posture; advised pt to get a portable desk table to use to raise laptop up to her eye level when at home and to use her text books under laptop in class; to raise books up to eye level as able for reading; to get a rolling bookbag with technique for rolling; how to perform bending at work by hinging at the hips to do biscuits   Person(s) Educated Patient   Methods Explanation;Demonstration;Handout   Comprehension Verbalized understanding;Returned demonstration          PT Short Term Goals - 05/29/17 1309      PT SHORT TERM GOAL #1   Title Pt will be I with HEP   Baseline issued at eval   Time 3   Period Weeks   Status New     PT SHORT TERM GOAL #2   Title Pt will report L knee popping to have decreased x 50% with functional activities   Baseline present >/= 3-4x/week   Time 3   Period Weeks   Status New     PT SHORT TERM GOAL #3   Title Pt will report  improved neutral spine postural awareness in class present at least 50% of the time for decreased LBP    Baseline unmet   Time 3   Period Weeks   Status New  PT Long Term Goals - 05/29/17 1302      PT LONG TERM GOAL #1   Title Pt will be able to squat down with </= 2/10 L knee pain   Baseline 5/10   Time 6   Period Weeks   Status New     PT LONG TERM GOAL #2   Title Pt will demo improved L knee strength per MMT >/= 1 MMT grade to assist with decreased L patella dislocations with activities   Baseline L hip flex 4-, knee ext 4+, knee flex 4+, abduct 3+, adduct 4+; R LE strength 5/5 throughout   Time 6   Period Weeks   Status New     PT LONG TERM GOAL #3   Title Pt will be able to demo improved lumbar/core strength to good + to assist with work duties   Baseline lumbar/core strength poor   Time 6   Period Weeks   Status New     PT LONG TERM GOAL #4   Title Pt will report 75% less LBP with work duties   Baseline unable   Time 6   Period Weeks   Status New                Plan - 05/29/17 1306    Clinical Impression Statement Pt would benefit from PT for L Quad/VMO/adduction strengthening to decrease L patella lateral popping out, postural/lumbar and core strengthening, postural education, and LBP management as needed to assist with improved painfree mobility. Pt is a fulltime student and also works.   Clinical Presentation Stable   Clinical Decision Making Low   Rehab Potential Excellent   PT Frequency 2x / week   PT Duration 6 weeks   PT Treatment/Interventions ADLs/Self Care Home Management;Cryotherapy;Electrical Stimulation;Functional mobility training;Ultrasound;Moist Heat;Therapeutic activities;Therapeutic exercise;Patient/family education;Manual techniques;Dry needling;Taping   PT Next Visit Plan review HEP; isometric hip hinge exercise for work simulation   PT Home Exercise Plan see pt education (SLR/VMO SLR, hip adduction, decompression  exercises- L sided for knee)   Consulted and Agree with Plan of Care Patient      Patient will benefit from skilled therapeutic intervention in order to improve the following deficits and impairments:  Decreased mobility, Decreased strength, Hypermobility, Impaired flexibility, Postural dysfunction, Improper body mechanics, Pain  Visit Diagnosis: Chronic pain of left knee - Plan: PT plan of care cert/re-cert, PT plan of care cert/re-cert  Muscle weakness (generalized) - Plan: PT plan of care cert/re-cert, PT plan of care cert/re-cert  Pain in thoracic spine - Plan: PT plan of care cert/re-cert, PT plan of care cert/re-cert  Chronic midline low back pain without sciatica - Plan: PT plan of care cert/re-cert, PT plan of care cert/re-cert  Abnormal posture - Plan: PT plan of care cert/re-cert, PT plan of care cert/re-cert     Problem List Patient Active Problem List   Diagnosis Date Noted  . Scalp itch 04/18/2017  . Encounter for vision screening 07/07/2016  . 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 Frequency of urination 12/03/2015  . Screening 12/03/2015  . Migraine without aura and without status migrainosus, not intractable 12/03/2015  . Hammer toe 11/03/2015  . Epigastric pain 08/25/2014  . ADHD (attention deficit hyperactivity disorder) 07/03/2013    ARTIS,Nara Paternoster, PT 05/29/2017, 1:21 PM  [facility] [address] [place], [place], [address] Phone: [phone]   Fax:  [phone]  Name: Hannah Palmer MRN: [id number] Date of Birth: 01/30/1996  

## 2017-05-31 ENCOUNTER — Telehealth: Payer: Self-pay | Admitting: *Deleted

## 2017-05-31 NOTE — Telephone Encounter (Signed)
"  I'm scheduled for surgery on 06/14/2017.  I am going to need to reschedule that.  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  So I want to wait until December like maybe the 12th to reschedule it."  Okay, that date is available.  I will get it rescheduled at the surgical center.  You will have to see Dr. Jacqualyn Posey again for another consult because your consent form will have expired by then.  I can transfer you to a scheduler to get an appointment if you like closer to that date if you like.  "Yes, that will be great."  Call was transferred to a scheduler.  I called Caren Griffins and rescheduled surgery to October 25, 2017.

## 2017-06-13 ENCOUNTER — Ambulatory Visit: Payer: Medicaid Other | Admitting: Physical Therapy

## 2017-06-13 ENCOUNTER — Telehealth: Payer: Self-pay | Admitting: Physical Therapy

## 2017-06-13 NOTE — Telephone Encounter (Signed)
Left message for patient regarding No Show this morning and reminded her of her next appointment. Advised of cancel policy should she no show for two appointments. Requested call back. Sinai Mahany C. Salome Hautala PT, DPT 06/13/17 9:36 AM

## 2017-06-16 ENCOUNTER — Ambulatory Visit: Payer: Medicaid Other | Attending: Sports Medicine | Admitting: Physical Therapy

## 2017-06-16 ENCOUNTER — Ambulatory Visit: Payer: Medicaid Other | Admitting: Physical Therapy

## 2017-06-16 ENCOUNTER — Telehealth: Payer: Self-pay | Admitting: Physical Therapy

## 2017-06-16 DIAGNOSIS — M6281 Muscle weakness (generalized): Secondary | ICD-10-CM | POA: Insufficient documentation

## 2017-06-16 DIAGNOSIS — R293 Abnormal posture: Secondary | ICD-10-CM | POA: Insufficient documentation

## 2017-06-16 DIAGNOSIS — M545 Low back pain: Secondary | ICD-10-CM | POA: Insufficient documentation

## 2017-06-16 DIAGNOSIS — G8929 Other chronic pain: Secondary | ICD-10-CM | POA: Insufficient documentation

## 2017-06-16 DIAGNOSIS — M25562 Pain in left knee: Secondary | ICD-10-CM | POA: Insufficient documentation

## 2017-06-16 DIAGNOSIS — M546 Pain in thoracic spine: Secondary | ICD-10-CM | POA: Insufficient documentation

## 2017-06-16 NOTE — Telephone Encounter (Signed)
Left message on mobile (preferred) number telling pt that this is her 2nd no show and the rest of her appointments will be cancelled per attendance policy. Was informed that she can schedule 1 apt at a time through 9/10 within [facility] approval. Requested call back. Leeanna Slaby C. Kalmen Lollar PT, DPT 06/16/17 8:26 AM

## 2017-06-20 ENCOUNTER — Encounter: Payer: Medicaid Other | Admitting: Physical Therapy

## 2017-06-23 ENCOUNTER — Encounter: Payer: Medicaid Other | Admitting: Physical Therapy

## 2017-06-27 ENCOUNTER — Encounter: Payer: Medicaid Other | Admitting: Physical Therapy

## 2017-06-28 ENCOUNTER — Ambulatory Visit: Payer: Medicaid Other | Admitting: Physical Therapy

## 2017-06-28 ENCOUNTER — Encounter: Payer: Self-pay | Admitting: Physical Therapy

## 2017-06-28 DIAGNOSIS — M545 Low back pain, unspecified: Secondary | ICD-10-CM

## 2017-06-28 DIAGNOSIS — M25562 Pain in left knee: Secondary | ICD-10-CM | POA: Diagnosis present

## 2017-06-28 DIAGNOSIS — G8929 Other chronic pain: Secondary | ICD-10-CM

## 2017-06-28 DIAGNOSIS — M6281 Muscle weakness (generalized): Secondary | ICD-10-CM | POA: Diagnosis present

## 2017-06-28 DIAGNOSIS — R293 Abnormal posture: Secondary | ICD-10-CM | POA: Diagnosis present

## 2017-06-28 DIAGNOSIS — M546 Pain in thoracic spine: Secondary | ICD-10-CM

## 2017-06-28 NOTE — Therapy (Signed)
[place], [place], [address] Phone: [phone]   Fax:  [phone]  Physical Therapy Treatment  Patient Details  Name: Hannah Palmer MRN: [id number] Date of Birth: 17-Aug-1996 Referring Provider: Thornton Papas  Encounter Date: 06/28/2017      PT End of Session - 06/28/17 1501    Visit Number 2   Number of Visits 12   Date for PT Re-Evaluation 07/10/17   Authorization Type MCD approved 12 visits 7/31-9/10   PT Start Time 1502   PT Stop Time 1544   PT Time Calculation (min) 42 min   Activity Tolerance Patient tolerated treatment well   Behavior During Therapy [facility] for tasks assessed/performed      Past Medical History:  Diagnosis Date  . ADHD (attention deficit hyperactivity disorder)   . Allergy   . Depression   . Migraine     Past Surgical History:  Procedure Laterality Date  . ADENOIDECTOMY  2001  . Base Wedge Osteotomy Right 02/27/2014   @ [place]  . OSTEOTOMY Right 02/27/2014   Met Head Rt #5 @ [facility]  . TONSILLECTOMY  Age 19  . TYMPANOSTOMY TUBE PLACEMENT      There were no vitals filed for this visit.      Subjective Assessment - 06/28/17 1502    Subjective Knee is okay. Kind of still gets wobbly when walking and turning, feels like it is going to pop out.    Currently in Pain? Yes   Pain Score 4    Pain Location Knee   Pain Orientation Left   Pain Descriptors / Indicators --  irritated                         [facility] Adult PT Treatment/Exercise - 06/28/17 0001      Therapeutic Activites    Therapeutic Activities Other Therapeutic Activities   Other Therapeutic Activities posture for computer ergonomics & while in class     Exercises   Exercises Knee/Hip     Knee/Hip Exercises: Stretches   Passive Hamstring Stretch Limitations supine with strap   Gastroc Stretch Limitations at wall     Knee/Hip Exercises: Machines for Strengthening   Cybex Knee  Extension 25lb   Cybex Knee Flexion 35lb     Knee/Hip Exercises: Supine   Bridges with Clamshell 15 reps  blue tband, in DF   Straight Leg Raises Both;15 reps     Knee/Hip Exercises: Sidelying   Hip ABduction 20 reps;10 reps;Both   Hip ADduction Both;15 reps                PT Education - 06/28/17 1545    Education provided Yes   Education Details exercise form/rationale, HEP, anatomy of condition, balance exercise and nutrition for weight loss   Person(s) Educated Patient   Methods Explanation;Demonstration;Tactile cues;Verbal cues;Handout   Comprehension Verbalized understanding;Returned demonstration;Verbal cues required;Tactile cues required;Need further instruction          PT Short Term Goals - 05/29/17 1309      PT SHORT TERM GOAL #1   Title Pt will be I with HEP   Baseline issued at eval   Time 3   Period Weeks   Status New     PT SHORT TERM GOAL #2   Title Pt will report L knee popping to have decreased x 50% with functional activities   Baseline present >/= 3-4x/week   Time 3   Period  Weeks   Status New     PT SHORT TERM GOAL #3   Title Pt will report improved neutral spine postural awareness in class present at least 50% of the time for decreased LBP    Baseline unmet   Time 3   Period Weeks   Status New           PT Long Term Goals - 05/29/17 1302      PT LONG TERM GOAL #1   Title Pt will be able to squat down with </= 2/10 L knee pain   Baseline 5/10   Time 6   Period Weeks   Status New     PT LONG TERM GOAL #2   Title Pt will demo improved L knee strength per MMT >/= 1 MMT grade to assist with decreased L patella dislocations with activities   Baseline L hip flex 4-, knee ext 4+, knee flex 4+, abduct 3+, adduct 4+; R LE strength 5/5 throughout   Time 6   Period Weeks   Status New     PT LONG TERM GOAL #3   Title Pt will be able to demo improved lumbar/core strength to good + to assist with work duties   Baseline lumbar/core  strength poor   Time 6   Period Weeks   Status New     PT LONG TERM GOAL #4   Title Pt will report 75% less LBP with work duties   Baseline unable   Time 6   Period Weeks   Status New               Plan - 06/28/17 1548    Clinical Impression Statement Weakness and fatigue notable in hip abductor group resulting in knee valgus and lateral patellar pull. Educated on biomechanical chain as well as importance of core engagement in exercise. Reviewed ergonomics while in class and on computer.    PT Treatment/Interventions ADLs/Self Care Home Management;Cryotherapy;Electrical Stimulation;Functional mobility training;Ultrasound;Moist Heat;Therapeutic activities;Therapeutic exercise;Patient/family education;Manual techniques;Dry needling;Taping   PT Next Visit Plan lumbopelvic strengthening   PT Home Exercise Plan hip abduciton/adduction, SLR, hamstring & gastroc stretch, bridge with tband   Consulted and Agree with Plan of Care Patient      Patient will benefit from skilled therapeutic intervention in order to improve the following deficits and impairments:  Decreased mobility, Decreased strength, Hypermobility, Impaired flexibility, Postural dysfunction, Improper body mechanics, Pain  Visit Diagnosis: Chronic pain of left knee  Muscle weakness (generalized)  Pain in thoracic spine  Chronic midline low back pain without sciatica     Problem List Patient Active Problem List   Diagnosis Date Noted  . Scalp itch 04/18/2017  . Encounter for vision screening 07/07/2016  . 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 Frequency of urination 12/03/2015  . Screening 12/03/2015  . Migraine without aura and without status migrainosus, not intractable 12/03/2015  . Hammer toe 11/03/2015  . Epigastric pain 08/25/2014  . ADHD (attention deficit hyperactivity disorder) 07/03/2013    Johney Perotti C. Cheney Ewart PT, DPT 06/28/17 3:52 PM   [facility] [address] [place], [place], [address] Phone: [phone]   Fax:  [phone]  Name: Hannah Palmer MRN: [id number] Date of Birth: 1996/10/22

## 2017-06-29 ENCOUNTER — Encounter: Payer: Medicaid Other | Admitting: Physical Therapy

## 2017-07-10 ENCOUNTER — Ambulatory Visit (INDEPENDENT_AMBULATORY_CARE_PROVIDER_SITE_OTHER): Payer: Medicaid Other | Admitting: Physician Assistant

## 2017-07-10 ENCOUNTER — Encounter (INDEPENDENT_AMBULATORY_CARE_PROVIDER_SITE_OTHER): Payer: Self-pay | Admitting: Physician Assistant

## 2017-07-10 VITALS — BP 114/72 | HR 60 | Temp 98.3°F | Wt 228.0 lb

## 2017-07-10 DIAGNOSIS — Z131 Encounter for screening for diabetes mellitus: Secondary | ICD-10-CM

## 2017-07-10 DIAGNOSIS — L299 Pruritus, unspecified: Secondary | ICD-10-CM | POA: Diagnosis not present

## 2017-07-10 DIAGNOSIS — L210 Seborrhea capitis: Secondary | ICD-10-CM

## 2017-07-10 DIAGNOSIS — B354 Tinea corporis: Secondary | ICD-10-CM | POA: Diagnosis not present

## 2017-07-10 LAB — POCT GLYCOSYLATED HEMOGLOBIN (HGB A1C): Hemoglobin A1C: 5.1

## 2017-07-10 MED ORDER — SELENIUM SULFIDE 2.25 % EX SHAM: 1.0000 "application " | MEDICATED_SHAMPOO | Freq: Every day | CUTANEOUS | 1 refills | Status: DC

## 2017-07-10 MED ORDER — CLOTRIMAZOLE 1 % EX CREA: 1.0000 "application " | TOPICAL_CREAM | Freq: Two times a day (BID) | CUTANEOUS | 0 refills | Status: DC

## 2017-07-10 MED ORDER — TRIAMCINOLONE ACETONIDE 0.5 % EX CREA: 1.0000 "application " | TOPICAL_CREAM | Freq: Two times a day (BID) | CUTANEOUS | 0 refills | Status: DC

## 2017-07-10 MED ORDER — TRIAMCINOLONE ACETONIDE 0.5 % EX CREA: 1.0000 "application " | TOPICAL_CREAM | Freq: Two times a day (BID) | CUTANEOUS | 0 refills | Status: AC

## 2017-07-10 NOTE — Patient Instructions (Signed)
Psoriasis Psoriasis is a long-term (chronic) condition of skin inflammation. It occurs because your immune system causes skin cells to form too quickly. As a result, too many skin cells grow and create raised, red patches (plaques) that look silvery on your skin. Plaques may appear anywhere on your body. They can be any size or shape. Psoriasis can come and go. The condition varies from mild to very severe. It cannot be passed from one person to another (not contagious). What are the causes? The cause of psoriasis is not known, but certain factors can make the condition worse. These include:  Damage or trauma to the skin, such as cuts, scrapes, sunburn, and dryness.  Lack of sunlight.  Certain medicines.  Alcohol.  Tobacco use.  Stress.  Infections caused by bacteria or viruses.  What increases the risk? This condition is more likely to develop in:  People with a family history of psoriasis.  People who are Caucasian.  People who are between the ages of 15-30 and 50-60 years old.  What are the signs or symptoms? There are five different types of psoriasis. You can have more than one type of psoriasis during your life. Types are:  Plaque.  Guttate.  Inverse.  Pustular.  Erythrodermic.  Each type of psoriasis has different symptoms.  Plaque psoriasis symptoms include red, raised plaques with a silvery white coating (scale). These plaques may be itchy. Your nails may be pitted and crumbly or fall off.  Guttate psoriasis symptoms include small red spots that often show up on your trunk, arms, and legs. These spots may develop after you have been sick, especially with strep throat.  Inverse psoriasis symptoms include plaques in your underarm area, under your breasts, or on your genitals, groin, or buttocks.  Pustular psoriasis symptoms include pus-filled bumps that are painful, red, and swollen on the palms of your hands or the soles of your feet. You also may feel  exhausted, feverish, weak, or have no appetite.  Erythrodermic psoriasis symptoms include bright red skin that may look burned. You may have a fast heartbeat and a body temperature that is too high or too low. You may be itchy or in pain.  How is this diagnosed? Your health care provider may suspect psoriasis based on your symptoms and family history. Your health care provider will also do a physical exam. This may include a procedure to remove a tissue sample (biopsy) for testing. You may also be referred to a health care provider who specializes in skin diseases (dermatologist). How is this treated? There is no cure for this condition, but treatment can help manage it. Goals of treatment include:  Helping your skin heal.  Reducing itching and inflammation.  Slowing the growth of new skin cells.  Helping your immune system respond better to your skin.  Treatment varies, depending on the severity of your condition. Treatment may include:  Creams or ointments.  Ultraviolet ray exposure (light therapy). This may include natural sunlight or light therapy in a medical office.  Medicines (systemic therapy). These medicines can help your body better manage skin cell turnover and inflammation. They may be used along with light therapy or ointments. You may also get antibiotic medicines if you have an infection.  Follow these instructions at home: Skin Care  Moisturize your skin as needed. Only use moisturizers that have been approved by your health care provider.  Apply cool compresses to the affected areas.  Do not scratch your skin. Lifestyle   Do not   use tobacco products. This includes cigarettes, chewing tobacco, and e-cigarettes. If you need help quitting, ask your health care provider.  Drink little or no alcohol.  Try techniques for stress reduction, such as meditation or yoga.  Get exposure to the sun as told by your health care provider. Do not get sunburned.  Consider  joining a psoriasis support group. Medicines  Take or use over-the-counter and prescription medicines only as told by your health care provider.  If you were prescribed an antibiotic, take or use it as told by your health care provider. Do not stop taking the antibiotic even if your condition starts to improve. General instructions  Keep a journal to help track what triggers an outbreak. Try to avoid any triggers.  See a counselor or social worker if feelings of sadness, frustration, and hopelessness about your condition are interfering with your work and relationships.  Keep all follow-up visits as told by your health care provider. This is important. Contact a health care provider if:  Your pain gets worse.  You have increasing redness or warmth in the affected areas.  You have new or worsening pain or stiffness in your joints.  Your nails start to break easily or pull away from the nail bed.  You have a fever.  You feel depressed. This information is not intended to replace advice given to you by your health care provider. Make sure you discuss any questions you have with your health care provider. Document Released: 10/28/2000 Document Revised: 04/07/2016 Document Reviewed: 03/18/2015 Elsevier Interactive Patient Education  2018 Elsevier Inc.  

## 2017-07-10 NOTE — Progress Notes (Signed)
Subjective:  Patient ID: Hannah Palmer, female    DOB: 05-16-1996  Age: 21 y.o. MRN: [id number]  CC: f/u scalp itch  HPI LEXINE JASPERS is a 21 y.o. female with a medical history of  ADHD, depression, and migraine headaches presents on f/u of scalp itch. Was seen here on two separate occasions, of which, both times she was completely asymptomatic and without any signs. I recommended she use clotrimazole 1% ointment on a small part of the scalp to see if there is alleviation but patient reports no alleviation was obtained. She does not currently have any erythema or outwards signs of infection/disease besides some mild dandruff. Has occasional itching, scaling, and crusting of the lower right auricle and behind the auricle.    Also has a small, round, finely scaled, and pruritic lesion on the right wrist. Lesion located under the watchband. Reports wearing watch when strap is wet.        Outpatient Medications Prior to Visit  Medication Sig Dispense Refill  . amphetamine-dextroamphetamine (ADDERALL XR) 10 MG 24 hr capsule Take 10 mg by mouth daily. Take between noon and 2 pm daily    . amphetamine-dextroamphetamine (ADDERALL XR) 30 MG 24 hr capsule Take 1 capsule (30 mg total) by mouth daily with breakfast. 30 capsule 0  . citalopram (CELEXA) 20 MG tablet Take 20 mg by mouth.    . diclofenac (CATAFLAM) 50 MG tablet Take 1 tablet (50 mg total) by mouth 3 (three) times daily. 90 tablet 0  . GUANFACINE HCL PO Take 1 tablet by mouth daily.    Marland Kitchen topiramate (TOPAMAX) 50 MG tablet Take 1 tablet (50 mg total) by mouth 2 (two) times daily. 60 tablet 12  . acetaminophen (TYLENOL) 325 MG tablet Take 650 mg by mouth every 6 (six) hours as needed.    . Aspirin-Acetaminophen-Caffeine (EXCEDRIN EXTRA STRENGTH PO) Take by mouth. As needed    . clotrimazole (LOTRIMIN) 1 % cream Apply 1 application topically 2 (two) times daily. (Patient not taking: Reported on 05/29/2017) 30 g 0  . fluticasone  (FLONASE) 50 MCG/ACT nasal spray Place 1 spray into the nose.    . ranitidine (ZANTAC 75) 75 MG tablet Take 1 tablet (75 mg total) by mouth 2 (two) times daily. (Patient not taking: Reported on 05/29/2017) 30 tablet 0  . sodium chloride (OCEAN) 0.65 % SOLN nasal spray Place 2 sprays into both nostrils as needed for congestion. 1 Bottle 1   No facility-administered medications prior to visit.      ROS Review of Systems  Constitutional: Negative for chills, fever and malaise/fatigue.  Eyes: Negative for blurred vision.  Respiratory: Negative for shortness of breath.   Cardiovascular: Negative for chest pain and palpitations.  Gastrointestinal: Negative for abdominal pain and nausea.  Genitourinary: Negative for dysuria and hematuria.  Musculoskeletal: Negative for joint pain and myalgias.  Skin: Positive for rash.  Neurological: Negative for tingling and headaches.  Psychiatric/Behavioral: Negative for depression. The patient is not nervous/anxious.     Objective:  BP 114/72 (BP Location: Right Arm, Patient Position: Sitting, Cuff Size: Large)   Pulse 60   Temp 98.3 F (36.8 C) (Oral)   Wt 228 lb (103.4 kg)   LMP 07/06/2017 (Approximate)   SpO2 96%   BMI 34.67 kg/m   BP/Weight 07/10/2017 04/17/2017 2/63/3354  Systolic BP 562 563 -  Diastolic BP 72 70 -  Wt. (Lbs) 228 233.4 229  BMI 34.67 35.49 34.82  Some encounter information is confidential  and restricted. Go to Review Flowsheets activity to see all data.      Physical Exam  Constitutional: She is oriented to person, place, and time.  Well developed, well nourished, NAD, polite, monotone  HENT:  Head: Normocephalic and atraumatic.  Eyes: Conjunctivae and EOM are normal. No scleral icterus.  Neck: Normal range of motion. Neck supple. No thyromegaly present.  Cardiovascular: Normal rate, regular rhythm and normal heart sounds.   Pulmonary/Chest: Effort normal and breath sounds normal.  Abdominal: Soft. Bowel sounds are  normal. There is no tenderness.  Musculoskeletal: She exhibits no edema.  Neurological: She is alert and oriented to person, place, and time.  Skin: Skin is warm and dry. No rash noted. No erythema. No pallor.  small, round, finely scaled lesion on the right wrist, no surrounding erythema or edema. Scalp and ears without erythema, scaling, crusting, edema, or excoriations. Mild dandruff noted  Psychiatric: She has a normal mood and affect. Her behavior is normal. Thought content normal.  Vitals reviewed.    Assessment & Plan:   1. Dandruff - Selenium Sulfide 2.25 % SHAM; Apply 1 application topically daily.  Dispense: 1 Bottle; Refill: 1  2. Scalp itch - triamcinolone cream (KENALOG) 0.5 %; Apply 1 application topically 2 (two) times daily. Do not use for more than 7 consecutive days without medical approval.  Dispense: 30 g; Refill: 0 - No physical signs of patient's stated symptoms. Possible psoriasis, patient shown pictures of psoriasis and she states her scalp and ears are similar in appearance to the pictures.  3. Ringworm of body - Located on right wrist under her watch band. - Begin clotrimazole (LOTRIMIN) 1 % cream; Apply 1 application topically 2 (two) times daily.  Dispense: 15 g; Refill: 0  4. Screening for diabetes mellitus - HgB A1c 5.1%   Meds ordered this encounter  Medications  . Selenium Sulfide 2.25 % SHAM    Sig: Apply 1 application topically daily.    Dispense:  1 Bottle    Refill:  1    Order Specific Question:   Supervising Provider    Answer:   Tresa Garter [id number]  . clotrimazole (LOTRIMIN) 1 % cream    Sig: Apply 1 application topically 2 (two) times daily.    Dispense:  15 g    Refill:  0    Order Specific Question:   Supervising Provider    Answer:   Tresa Garter [id number]  . triamcinolone cream (KENALOG) 0.5 %    Sig: Apply 1 application topically 2 (two) times daily. Do not use for more than 7 consecutive days without medical  approval.    Dispense:  30 g    Refill:  0    Order Specific Question:   Supervising Provider    Answer:   Tresa Garter [[id number]]    Follow-up: Return if symptoms worsen or fail to improve.   Clent Demark PA

## 2017-07-12 ENCOUNTER — Encounter: Payer: Self-pay | Admitting: Physical Therapy

## 2017-07-12 ENCOUNTER — Ambulatory Visit: Payer: Medicaid Other | Admitting: Physical Therapy

## 2017-07-12 DIAGNOSIS — M6281 Muscle weakness (generalized): Secondary | ICD-10-CM

## 2017-07-12 DIAGNOSIS — M25562 Pain in left knee: Secondary | ICD-10-CM | POA: Diagnosis not present

## 2017-07-12 DIAGNOSIS — M546 Pain in thoracic spine: Secondary | ICD-10-CM

## 2017-07-12 DIAGNOSIS — M545 Low back pain: Secondary | ICD-10-CM

## 2017-07-12 DIAGNOSIS — R293 Abnormal posture: Secondary | ICD-10-CM

## 2017-07-12 DIAGNOSIS — G8929 Other chronic pain: Secondary | ICD-10-CM

## 2017-07-12 NOTE — Addendum Note (Signed)
Addended by: Selinda Eon C on: 07/12/2017 04:01 PM   Modules accepted: Orders

## 2017-07-12 NOTE — Therapy (Signed)
[place], [place], [address] Phone: [phone]   Fax:  [phone]  Physical Therapy Treatment  Patient Details  Name: Hannah Palmer MRN: [id number] Date of Birth: October 15, 1996 Referring Provider: Thornton Papas  Encounter Date: 07/12/2017      PT End of Session - 07/12/17 1332    Visit Number 3   Number of Visits 12   Date for PT Re-Evaluation 07/24/17   Authorization Type MCD approved 12 visits 7/31-9/10   PT Start Time 1332   PT Stop Time 1413   PT Time Calculation (min) 41 min   Activity Tolerance Patient tolerated treatment well   Behavior During Therapy [facility] for tasks assessed/performed      Past Medical History:  Diagnosis Date  . ADHD (attention deficit hyperactivity disorder)   . Allergy   . Depression   . Migraine     Past Surgical History:  Procedure Laterality Date  . ADENOIDECTOMY  2001  . Base Wedge Osteotomy Right 02/27/2014   @ [place]  . OSTEOTOMY Right 02/27/2014   Met Head Rt #5 @ [facility]  . TONSILLECTOMY  Age 23  . TYMPANOSTOMY TUBE PLACEMENT      There were no vitals filed for this visit.      Subjective Assessment - 07/12/17 1333    Subjective Pt denies pain in knee today. Pt reports she is tryiing to improve her posture but is overthinking it.    Patient Stated Goals to correct my posture   Currently in Pain? No/denies            [facility] PT Assessment - 07/12/17 0001      ROM / Strength   AROM / PROM / Strength Strength     Strength   Strength Assessment Site Hip;Knee   Right/Left Hip Right;Left   Right Hip Flexion 5/5   Right Hip Extension 5/5   Right Hip ABduction 4+/5   Left Hip Flexion 5/5   Left Hip Extension 5/5   Left Hip ABduction 4/5   Right/Left Knee Right;Left   Right Knee Flexion 5/5   Right Knee Extension 5/5   Left Knee Flexion 5/5   Left Knee Extension 5/5                     [facility] Adult PT Treatment/Exercise - 07/12/17  0001      Knee/Hip Exercises: Aerobic   Nustep 5 min L7 UE & LE     Knee/Hip Exercises: Machines for Strengthening   Cybex Knee Extension 35lb   Cybex Knee Flexion 35lb   Cybex Leg Press 75lb     Knee/Hip Exercises: Standing   Functional Squat Limitations blue tband around knees, squat to tap table     Knee/Hip Exercises: Supine   Other Supine Knee/Hip Exercises dead bug     Knee/Hip Exercises: Prone   Hip Extension Limitations qped hip ext & bird dog   Straight Leg Raises Limitations primal push up                PT Education - 07/12/17 1415    Education provided Yes   Education Details patella/knee anatomy, importance of posture, postural alignment, exercise form/rationale   Person(s) Educated Patient   Methods Explanation;Demonstration;Tactile cues;Verbal cues;Handout   Comprehension Verbalized understanding;Returned demonstration;Verbal cues required;Tactile cues required;Need further instruction          PT Short Term Goals - 07/12/17 1338      PT SHORT TERM  GOAL #1   Title Pt will be I with HEP   Baseline "a little, not as much as I want to"   Status Partially Met     PT SHORT TERM GOAL #2   Title Pt will report L knee popping to have decreased x 50% with functional activities   Baseline griding when squatting down, "sometimes slides sideways but does not pop out of socket"   Status Achieved     PT SHORT TERM GOAL #3   Title Pt will report improved neutral spine postural awareness in class present at least 50% of the time for decreased LBP    Baseline has tried to focus, overthinking it   Status Partially Met           PT Long Term Goals - 07/12/17 1340      PT LONG TERM GOAL #1   Title Pt will be able to squat down with </= 2/10 L knee pain   Baseline It does not hurt, it's just uncomfortable   Status On-going     PT LONG TERM GOAL #2   Title Pt will demo improved L knee strength per MMT >/= 1 MMT grade to assist with decreased L patella  dislocations with activities     PT LONG TERM GOAL #3   Title Pt will be able to demo improved lumbar/core strength to good + to assist with work duties   Baseline continuing to educate   Status On-going     PT LONG TERM GOAL #4   Title Pt will report 75% less LBP with work duties   Baseline 5-6/10   Status On-going               Plan - 07/12/17 1413    Clinical Impression Statement pt has had poor attendance with PT but did demo improvement in strength and reports decreased pain levels compared to initial evaluation. will benefit from further skilled PT for strengthening knee and postural alignment.    PT Treatment/Interventions ADLs/Self Care Home Management;Cryotherapy;Electrical Stimulation;Functional mobility training;Ultrasound;Moist Heat;Therapeutic activities;Therapeutic exercise;Patient/family education;Manual techniques;Dry needling;Taping   PT Next Visit Plan lumbopelvic strengthening   PT Home Exercise Plan hip abduciton/adduction, SLR, hamstring & gastroc stretch, bridge with tband; dead bug, bird dog, primal push up;    Consulted and Agree with Plan of Care Patient      Patient will benefit from skilled therapeutic intervention in order to improve the following deficits and impairments:  Decreased mobility, Decreased strength, Hypermobility, Impaired flexibility, Postural dysfunction, Improper body mechanics, Pain  Visit Diagnosis: Chronic pain of left knee  Muscle weakness (generalized)  Chronic midline low back pain without sciatica  Pain in thoracic spine  Abnormal posture     Problem List Patient Active Problem List   Diagnosis Date Noted  . Scalp itch 04/18/2017  . Encounter for vision screening 07/07/2016  . 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 Frequency of urination 12/03/2015  . Screening 12/03/2015  . Migraine without aura and without status migrainosus, not intractable 12/03/2015  . Hammer toe 11/03/2015  . Epigastric pain 08/25/2014   . ADHD (attention deficit hyperactivity disorder) 07/03/2013   Arriyanna Mersch C. Brodyn Depuy PT, DPT 07/12/17 2:16 PM   [facility] [address] [place], [place], [address] Phone: [phone]   Fax:  [phone]  Name: Hannah Palmer MRN: [id number] Date of Birth: February 03, 1996

## 2017-07-14 ENCOUNTER — Other Ambulatory Visit (INDEPENDENT_AMBULATORY_CARE_PROVIDER_SITE_OTHER): Payer: Self-pay | Admitting: Physician Assistant

## 2017-07-14 DIAGNOSIS — L299 Pruritus, unspecified: Secondary | ICD-10-CM

## 2017-07-14 MED ORDER — TRIAMCINOLONE ACETONIDE 0.1 % EX CREA: TOPICAL_CREAM | Freq: Two times a day (BID) | CUTANEOUS | Status: DC

## 2017-07-19 ENCOUNTER — Ambulatory Visit: Payer: Medicaid Other | Attending: Sports Medicine

## 2017-07-19 DIAGNOSIS — M545 Low back pain: Secondary | ICD-10-CM | POA: Insufficient documentation

## 2017-07-19 DIAGNOSIS — G8929 Other chronic pain: Secondary | ICD-10-CM | POA: Diagnosis present

## 2017-07-19 DIAGNOSIS — R293 Abnormal posture: Secondary | ICD-10-CM

## 2017-07-19 DIAGNOSIS — M25562 Pain in left knee: Secondary | ICD-10-CM | POA: Diagnosis present

## 2017-07-19 DIAGNOSIS — M546 Pain in thoracic spine: Secondary | ICD-10-CM | POA: Insufficient documentation

## 2017-07-19 DIAGNOSIS — M6281 Muscle weakness (generalized): Secondary | ICD-10-CM | POA: Insufficient documentation

## 2017-07-20 ENCOUNTER — Encounter: Payer: Self-pay | Admitting: Physical Therapy

## 2017-07-20 ENCOUNTER — Ambulatory Visit: Payer: Medicaid Other | Admitting: Physical Therapy

## 2017-07-20 DIAGNOSIS — M6281 Muscle weakness (generalized): Secondary | ICD-10-CM

## 2017-07-20 DIAGNOSIS — G8929 Other chronic pain: Secondary | ICD-10-CM

## 2017-07-20 DIAGNOSIS — M25562 Pain in left knee: Secondary | ICD-10-CM | POA: Diagnosis not present

## 2017-07-20 NOTE — Therapy (Signed)
[place], [place], [address] Phone: [phone]   Fax:  [phone]  Physical Therapy Treatment  Patient Details  Name: Hannah Palmer MRN: [id number] Date of Birth: Apr 09, 1996 Referring Provider: Dr Sharol Given  Encounter Date: 07/19/2017      PT End of Session - 07/19/17 1600    Visit Number 4   Number of Visits 12   Date for PT Re-Evaluation 07/24/17   Authorization Type MCD approved 12 visits 7/31-9/10   PT Start Time 1346   PT Stop Time 1430   PT Time Calculation (min) 44 min   Activity Tolerance Patient tolerated treatment well   Behavior During Therapy [facility] for tasks assessed/performed      Past Medical History:  Diagnosis Date  . ADHD (attention deficit hyperactivity disorder)   . Allergy   . Depression   . Migraine     Past Surgical History:  Procedure Laterality Date  . ADENOIDECTOMY  2001  . Base Wedge Osteotomy Right 02/27/2014   @ [place]  . OSTEOTOMY Right 02/27/2014   Met Head Rt #5 @ [facility]  . TONSILLECTOMY  Age 69  . TYMPANOSTOMY TUBE PLACEMENT      There were no vitals filed for this visit.      Subjective Assessment - 07/19/17 1634   Subjective Pt. doing well today however noting both knees hurting somewhat worse than usual.     Patient Stated Goals to correct my posture   Currently in Pain? Yes   Pain Score 0-No pain   Pain Location Knee   Pain Orientation Left;Right   Pain Descriptors / Indicators Sharp   Pain Type Chronic pain   Pain Onset More than a month ago   Pain Frequency Constant   Aggravating Factors  activity, bending down    Pain Relieving Factors stretching    Multiple Pain Sites No                         [facility] Adult PT Treatment/Exercise - 07/19/17 1634      Knee/Hip Exercises: Stretches   Gastroc Stretch 2 reps;30 seconds   Gastroc Stretch Limitations at wall     Knee/Hip Exercises: Aerobic   Nustep 5 min L7 UE & LE     Knee/Hip  Exercises: Standing   Step Down 15 reps;Step Height: 6";Right;Left;Hand Hold: 0   Step Down Limitations green TB TKE with therapist    Functional Squat 15 reps;3 seconds   Functional Squat Limitations with ball squeeze between knees      Knee/Hip Exercises: Seated   Other Seated Knee/Hip Exercises Seated 3-way lumbar stretch x 20 sec each way     Knee/Hip Exercises: Supine   Bridges with Diona Foley Squeeze 2 sets;15 reps;Both   Bridges with Clamshell 20 reps  green TB at knees (sent home with pt. as pt. lost original)   Straight Leg Raises Both;20 reps   Straight Leg Raises Limitations 3#                PT Education - 07/19/17 1634    Education provided Yes   Education Details UT stretch    Person(s) Educated Patient   Methods Explanation;Demonstration;Verbal cues;Handout   Comprehension Verbalized understanding;Returned demonstration;Verbal cues required;Need further instruction          PT Short Term Goals - 07/12/17 1338      PT SHORT TERM GOAL #1   Title Pt will be I with  HEP   Baseline "a little, not as much as I want to"   Status Partially Met     PT SHORT TERM GOAL #2   Title Pt will report L knee popping to have decreased x 50% with functional activities   Baseline griding when squatting down, "sometimes slides sideways but does not pop out of socket"   Status Achieved     PT SHORT TERM GOAL #3   Title Pt will report improved neutral spine postural awareness in class present at least 50% of the time for decreased LBP    Baseline has tried to focus, overthinking it   Status Partially Met           PT Long Term Goals - 07/12/17 1340      PT LONG TERM GOAL #1   Title Pt will be able to squat down with </= 2/10 L knee pain   Baseline It does not hurt, it's just uncomfortable   Status On-going     PT LONG TERM GOAL #2   Title Pt will demo improved L knee strength per MMT >/= 1 MMT grade to assist with decreased L patella dislocations with activities      PT LONG TERM GOAL #3   Title Pt will be able to demo improved lumbar/core strength to good + to assist with work duties   Baseline continuing to educate   Status On-going     PT LONG TERM GOAL #4   Title Pt will report 75% less LBP with work duties   Baseline 5-6/10   Status On-going               Plan - 07/19/17 1634   Clinical Impression Statement Pt. doing well today.  Some knee pain with nustep warm up however unable to reproduce knee pain with therex activities.  looped green TB issued to pt. for HEP as she lost last issued TB.  Treatment focused on quad/VMO strengthening activities with pt. tolerating all activities well.     PT Treatment/Interventions ADLs/Self Care Home Management;Cryotherapy;Electrical Stimulation;Functional mobility training;Ultrasound;Moist Heat;Therapeutic activities;Therapeutic exercise;Patient/family education;Manual techniques;Dry needling;Taping   PT Next Visit Plan lumbopelvic strengthening   PT Home Exercise Plan hip abduciton/adduction, SLR, hamstring & gastroc stretch, bridge with tband; dead bug, bird dog, primal push up;       Patient will benefit from skilled therapeutic intervention in order to improve the following deficits and impairments:  Decreased mobility, Decreased strength, Hypermobility, Impaired flexibility, Postural dysfunction, Improper body mechanics, Pain  Visit Diagnosis: Chronic pain of left knee  Muscle weakness (generalized)  Chronic midline low back pain without sciatica  Pain in thoracic spine  Abnormal posture     Problem List Patient Active Problem List   Diagnosis Date Noted  . Scalp itch 04/18/2017  . Encounter for vision screening 07/07/2016  . 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 Frequency of urination 12/03/2015  . Screening 12/03/2015  . Migraine without aura and without status migrainosus, not intractable 12/03/2015  . Hammer toe 11/03/2015  . Epigastric pain 08/25/2014  . ADHD (attention  deficit hyperactivity disorder) 07/03/2013    Bess Harvest, PTA 07/20/17 12:17 PM  [facility] [address] [place], [place], [address] Phone: [phone]   Fax:  [phone]  Name: ROBIN PETRAKIS MRN: [id number] Date of Birth: 04-Apr-1996

## 2017-07-20 NOTE — Therapy (Signed)
[place], [place], [address] Phone: [phone]   Fax:  [phone]  Physical Therapy Treatment  Patient Details  Name: Hannah Palmer MRN: [id number] Date of Birth: Mar 25, 1996 Referring Provider: Dr Sharol Given  Encounter Date: 07/20/2017      PT End of Session - 07/20/17 1308    Visit Number 5   Number of Visits 12   Date for PT Re-Evaluation 07/24/17   Authorization Type MCD approved 12 visits 7/31-9/10   PT Start Time 1105   PT Stop Time 1145   PT Time Calculation (min) 40 min   Activity Tolerance Patient tolerated treatment well   Behavior During Therapy [facility] for tasks assessed/performed      Past Medical History:  Diagnosis Date  . ADHD (attention deficit hyperactivity disorder)   . Allergy   . Depression   . Migraine     Past Surgical History:  Procedure Laterality Date  . ADENOIDECTOMY  2001  . Base Wedge Osteotomy Right 02/27/2014   @ [place]  . OSTEOTOMY Right 02/27/2014   Met Head Rt #5 @ [facility]  . TONSILLECTOMY  Age 6  . TYMPANOSTOMY TUBE PLACEMENT      There were no vitals filed for this visit.      Subjective Assessment - 07/20/17 1214    Subjective Pt. doing well today however noting both knees hurting somewhat worse than usual.     Patient Stated Goals to correct my posture   Currently in Pain? Yes   Pain Score 0-No pain   Pain Location Knee   Pain Orientation Left;Right   Pain Descriptors / Indicators Sharp   Pain Type Chronic pain   Pain Onset More than a month ago   Pain Frequency Constant   Aggravating Factors  activity, bending down    Pain Relieving Factors stretching    Multiple Pain Sites No                         [facility] Adult PT Treatment/Exercise - 07/20/17 1206      Knee/Hip Exercises: Stretches   Gastroc Stretch 2 reps;30 seconds   Gastroc Stretch Limitations at wall     Knee/Hip Exercises: Aerobic   Nustep 5 min L7 UE & LE     Knee/Hip Exercises: Standing   Step Down 15 reps;Step Height: 6";Right;Left;Hand Hold: 0   Step Down Limitations green TB TKE with therapist    Functional Squat 15 reps;3 seconds   Functional Squat Limitations with ball squeeze between knees      Knee/Hip Exercises: Seated   Other Seated Knee/Hip Exercises Seated 3-way lumbar stretch x 20 sec each way     Knee/Hip Exercises: Supine   Bridges with Diona Foley Squeeze 2 sets;15 reps;Both   Bridges with Clamshell 20 reps  green TB at knees (sent home with pt. as pt. lost original)   Straight Leg Raises Both;20 reps   Straight Leg Raises Limitations 3#                PT Education - 07/20/17 1307    Education provided Yes   Education Details POC, gym equipment, importance of regular exercises   Person(s) Educated Patient   Methods Explanation;Demonstration;Tactile cues;Verbal cues   Comprehension Verbalized understanding;Returned demonstration;Verbal cues required;Tactile cues required;Need further instruction          PT Short Term Goals - 07/12/17 1338      PT SHORT TERM GOAL #1  Title Pt will be I with HEP   Baseline "a little, not as much as I want to"   Status Partially Met     PT SHORT TERM GOAL #2   Title Pt will report L knee popping to have decreased x 50% with functional activities   Baseline griding when squatting down, "sometimes slides sideways but does not pop out of socket"   Status Achieved     PT SHORT TERM GOAL #3   Title Pt will report improved neutral spine postural awareness in class present at least 50% of the time for decreased LBP    Baseline has tried to focus, overthinking it   Status Partially Met           PT Long Term Goals - 07/12/17 1340      PT LONG TERM GOAL #1   Title Pt will be able to squat down with </= 2/10 L knee pain   Baseline It does not hurt, it's just uncomfortable   Status On-going     PT LONG TERM GOAL #2   Title Pt will demo improved L knee strength per MMT >/= 1  MMT grade to assist with decreased L patella dislocations with activities     PT LONG TERM GOAL #3   Title Pt will be able to demo improved lumbar/core strength to good + to assist with work duties   Baseline continuing to educate   Status On-going     PT LONG TERM GOAL #4   Title Pt will report 75% less LBP with work duties   Baseline 5-6/10   Status On-going               Plan - 07/20/17 1309    Clinical Impression Statement Discussed use of cardio equipment when she returns to the gym. Pt reports she is very busy with school and work so is not exercising as much as she should. We discussed how to incorporate quick exercises while she is studying and importance of regular mobility. Pt will be d/c on Monday to independent program.    PT Treatment/Interventions ADLs/Self Care Home Management;Cryotherapy;Electrical Stimulation;Functional mobility training;Ultrasound;Moist Heat;Therapeutic activities;Therapeutic exercise;Patient/family education;Manual techniques;Dry needling;Taping   PT Next Visit Plan d/c   PT Home Exercise Plan hip abduciton/adduction, SLR, hamstring & gastroc stretch, bridge with tband; dead bug, bird dog, primal push up;    Consulted and Agree with Plan of Care Patient      Patient will benefit from skilled therapeutic intervention in order to improve the following deficits and impairments:  Decreased mobility, Decreased strength, Hypermobility, Impaired flexibility, Postural dysfunction, Improper body mechanics, Pain  Visit Diagnosis: Chronic pain of left knee  Muscle weakness (generalized)     Problem List Patient Active Problem List   Diagnosis Date Noted  . Scalp itch 04/18/2017  . Encounter for vision screening 07/07/2016  . 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 Frequency of urination 12/03/2015  . Screening 12/03/2015  . Migraine without aura and without status migrainosus, not intractable 12/03/2015  . Hammer toe 11/03/2015  . Epigastric  pain 08/25/2014  . ADHD (attention deficit hyperactivity disorder) 07/03/2013   Oriel Ojo C. Shareena Nusz PT, DPT 07/20/17 1:12 PM   [facility] [address] [place], [place], [address] Phone: [phone]   Fax:  [phone]  Name: Hannah Palmer MRN: [id number] Date of Birth: 1996-09-03

## 2017-07-24 ENCOUNTER — Encounter: Payer: Self-pay | Admitting: Physical Therapy

## 2017-07-24 ENCOUNTER — Ambulatory Visit: Payer: Medicaid Other | Attending: Sports Medicine | Admitting: Physical Therapy

## 2017-07-24 DIAGNOSIS — R293 Abnormal posture: Secondary | ICD-10-CM | POA: Diagnosis present

## 2017-07-24 DIAGNOSIS — M545 Low back pain, unspecified: Secondary | ICD-10-CM

## 2017-07-24 DIAGNOSIS — R29898 Other symptoms and signs involving the musculoskeletal system: Secondary | ICD-10-CM | POA: Diagnosis present

## 2017-07-24 DIAGNOSIS — M25562 Pain in left knee: Secondary | ICD-10-CM | POA: Insufficient documentation

## 2017-07-24 DIAGNOSIS — G8929 Other chronic pain: Secondary | ICD-10-CM | POA: Diagnosis present

## 2017-07-24 DIAGNOSIS — M546 Pain in thoracic spine: Secondary | ICD-10-CM

## 2017-07-24 DIAGNOSIS — M6281 Muscle weakness (generalized): Secondary | ICD-10-CM | POA: Insufficient documentation

## 2017-07-24 DIAGNOSIS — M25369 Other instability, unspecified knee: Secondary | ICD-10-CM | POA: Insufficient documentation

## 2017-07-24 NOTE — Patient Instructions (Signed)
Issued from exercise drawer: Scissors, clamshell, pilates style 3-4 x a week, 3-10 reps  Isometric abdominals Sitting,  Single hand, double hand presses into thigh,  Sitting on the edge with good posture. 5-10 X as a way to keep up the exercises during a busy day.

## 2017-07-24 NOTE — Therapy (Addendum)
[place], [place], [address] Phone: [phone]   Fax:  [phone]  Physical Therapy Treatment/Discharge Summary  Patient Details  Name: Hannah Palmer MRN: [id number] Date of Birth: [birth year] Referring Provider: Dr Sharol Given  Encounter Date: 07/24/2017      PT End of Session - 07/24/17 1214    Visit Number 6   Number of Visits 12   Date for PT Re-Evaluation 07/24/17   PT Start Time 0735   PT Stop Time 0805   PT Time Calculation (min) 30 min   Activity Tolerance Patient tolerated treatment well   Behavior During Therapy [facility] for tasks assessed/performed      Past Medical History:  Diagnosis Date  . ADHD (attention deficit hyperactivity disorder)   . Allergy   . Depression   . Migraine     Past Surgical History:  Procedure Laterality Date  . ADENOIDECTOMY  2001  . Base Wedge Osteotomy Right 02/27/2014   @ [place]  . OSTEOTOMY Right 02/27/2014   Met Head Rt #5 @ [facility]  . TONSILLECTOMY  Age 82  . TYMPANOSTOMY TUBE PLACEMENT      There were no vitals filed for this visit.      Subjective Assessment - 07/24/17 0825    Subjective Last vivit today.  Still gets pain with studying ,  working.  Carrying backpack            [facility] PT Assessment - 07/24/17 0001      Observation/Other Assessments   Focus on Therapeutic Outcomes (FOTO)  67% ability(33% limitation)      Strength   Right Knee Flexion 5/5   Right Knee Extension 5/5   Left Knee Flexion 5/5   Left Knee Extension 5/5                     [facility] Adult PT Treatment/Exercise - 07/24/17 0001      Self-Care   Self-Care Other Self-Care Comments   Other Self-Care Comments  backpack don, doff placed on counter,  use waist strap,  cinch shoulder straps tight placing pack higher on back.  info on a new spring loaded bookbag given web site. Also education on work station posture,  sitting posture practiced with pillows and rolled  towel.  Patient able to sit without strain when supported.  Frequent rests suggested.,  demonstrated standing posture,     Knee/Hip Exercises: Seated   Other Seated Knee/Hip Exercises Isometric abdominals, single hand, double hands pressing into knees.  5 X 1 long breath.  Advised to do off and on during the day to help assist with abdominal strength, endurance.      Knee/Hip Exercises: Supine   Bridges Limitations HEP  10 x   Other Supine Knee/Hip Exercises Pilates type scissors,  verbally reviewed due to time constraints.      Knee/Hip Exercises: Sidelying   Clams HEP,  pilates type.  Verbally reviewed due to time constraints.                PT Education - 07/24/17 1213    Education provided Yes   Education Details HEP,  Self care   Person(s) Educated Patient   Methods Explanation;Demonstration;Tactile cues;Verbal cues;Handout   Comprehension Verbalized understanding;Returned demonstration          PT Short Term Goals - 07/24/17 1214      PT SHORT TERM GOAL #1   Title Pt will be I with HEP   Baseline  independent, understands how to do.   Time 3   Period Weeks   Status Achieved     PT SHORT TERM GOAL #2   Title Pt will report L knee popping to have decreased x 50% with functional activities   Time 3   Period Weeks   Status Achieved     PT SHORT TERM GOAL #3   Title Pt will report improved neutral spine postural awareness in class present at least 50% of the time for decreased LBP    Baseline % not assessed.  Doing a lot more in class   Time 3   Period Weeks   Status Partially Met           PT Long Term Goals - 07/24/17 1216      PT LONG TERM GOAL #1   Title Pt will be able to squat down with </= 2/10 L knee pain   Baseline able to do   Time 6   Period Weeks   Status Achieved     PT LONG TERM GOAL #2   Title Pt will demo improved L knee strength per MMT >/= 1 MMT grade to assist with decreased L patella dislocations with activities   Baseline 5/5  left knee mmt   Time 6   Period Weeks   Status Achieved     PT LONG TERM GOAL #3   Title Pt will be able to demo improved lumbar/core strength to good + to assist with work duties   Baseline able to demo good core contraction.  Specific core strength tasks not assessed.   Time 6   Period Weeks   Status Partially Met     PT LONG TERM GOAL #4   Title Pt will report 75% less LBP with work duties   Baseline Pain with work duties is improving.  Patient did not give % of improvement.   Time 6   Period Weeks   Status Partially Met               Plan - 07/24/17 1222    Clinical Impression Statement This was patient's last session today.  She has met or partially met her goals.  Extra time spent on education with posture, book bag and how to get exercises completed on a busy work/ school day.  She really needs a different book bag,  one with wheels or perhaps one with springs. (New)   PT Next Visit Plan d/c   PT Home Exercise Plan hip abduciton/adduction, SLR, hamstring & gastroc stretch, bridge with tband; dead bug, bird dog, primal push up; Pilates  clam and scissors, isometric abdominals   Consulted and Agree with Plan of Care Patient      Patient will benefit from skilled therapeutic intervention in order to improve the following deficits and impairments:     Visit Diagnosis: Chronic pain of left knee  Muscle weakness (generalized)  Chronic midline low back pain without sciatica  Pain in thoracic spine  Abnormal posture  Knee instability, unspecified laterality  Weakness of both lower extremities     Problem List Patient Active Problem List   Diagnosis Date Noted  . Scalp itch 04/18/2017  . Encounter for vision screening 07/07/2016  . 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 Frequency of urination 12/03/2015  . Screening 12/03/2015  . Migraine without aura and without status migrainosus, not intractable 12/03/2015  . Hammer toe 11/03/2015  . Epigastric pain  08/25/2014  . ADHD (attention deficit hyperactivity disorder) 07/03/2013  HARRIS,KAREN PTA 07/24/2017, 12:29 PM  [facility] [address] [place], [place], [address] Phone: [phone]   Fax:  [phone]  Name: Hannah Palmer MRN: [id number] Date of Birth: 20-Dec-1995   PHYSICAL THERAPY DISCHARGE SUMMARY  Visits from Start of Care: 6  Current functional level related to goals / functional outcomes: See above   Remaining deficits: See above   Education / Equipment: Anatomy of condition, POC, HEP, exercise form/rationale  Plan: Patient agrees to discharge.  Patient goals were partially met. Patient is being discharged due to financial reasons.  ?????    End of medicaid approval.  Jessica C. Hightower PT, DPT 07/24/17 12:37 PM

## 2017-07-24 NOTE — Therapy (Signed)
[place], [place], [address] Phone: [phone]   Fax:  [phone]  Physical Therapy Treatment  Patient Details  Name: RAND BOLLER MRN: [id number] Date of Birth: 1995-12-27 Referring Provider: Dr Sharol Given  Encounter Date: 07/24/2017      PT End of Session - 07/24/17 1214    Visit Number 6   Number of Visits 12   Date for PT Re-Evaluation 07/24/17   PT Start Time 0735   PT Stop Time 0805   PT Time Calculation (min) 30 min   Activity Tolerance Patient tolerated treatment well   Behavior During Therapy [facility] for tasks assessed/performed      Past Medical History:  Diagnosis Date  . ADHD (attention deficit hyperactivity disorder)   . Allergy   . Depression   . Migraine     Past Surgical History:  Procedure Laterality Date  . ADENOIDECTOMY  2001  . Base Wedge Osteotomy Right 02/27/2014   @ [facility]  . OSTEOTOMY Right 02/27/2014   Met Head Rt #5 @ [facility]  . TONSILLECTOMY  Age 79  . TYMPANOSTOMY TUBE PLACEMENT      There were no vitals filed for this visit.      Subjective Assessment - 07/24/17 0825    Subjective Last vivit today.  Still gets pain with studying ,  working.  Carrying backpack            [facility] PT Assessment - 07/24/17 0001      Observation/Other Assessments   Focus on Therapeutic Outcomes (FOTO)  67% ability(33% limitation)      Strength   Right Knee Flexion 5/5   Right Knee Extension 5/5   Left Knee Flexion 5/5   Left Knee Extension 5/5                     [facility] Adult PT Treatment/Exercise - 07/24/17 0001      Self-Care   Self-Care Other Self-Care Comments   Other Self-Care Comments  backpack don, doff placed on counter,  use waist strap,  cinch shoulder straps tight placing pack higher on back.  info on a new spring loaded bookbag given web site. Also education on work station posture,  sitting posture practiced with pillows and rolled towel.  Patient  able to sit without strain when supported.  Frequent rests suggested.,  demonstrated standing posture,     Knee/Hip Exercises: Seated   Other Seated Knee/Hip Exercises Isometric abdominals, single hand, double hands pressing into knees.  5 X 1 long breath.  Advised to do off and on during the day to help assist with abdominal strength, endurance.      Knee/Hip Exercises: Supine   Bridges Limitations HEP  10 x   Other Supine Knee/Hip Exercises Pilates type scissors,  verbally reviewed due to time constraints.      Knee/Hip Exercises: Sidelying   Clams HEP,  pilates type.  Verbally reviewed due to time constraints.                PT Education - 07/24/17 1213    Education provided Yes   Education Details HEP,  Self care   Person(s) Educated Patient   Methods Explanation;Demonstration;Tactile cues;Verbal cues;Handout   Comprehension Verbalized understanding;Returned demonstration          PT Short Term Goals - 07/24/17 1214      PT SHORT TERM GOAL #1   Title Pt will be I with HEP   Baseline independent,  understands how to do.   Time 3   Period Weeks   Status Achieved     PT SHORT TERM GOAL #2   Title Pt will report L knee popping to have decreased x 50% with functional activities   Time 3   Period Weeks   Status Achieved     PT SHORT TERM GOAL #3   Title Pt will report improved neutral spine postural awareness in class present at least 50% of the time for decreased LBP    Baseline % not assessed.  Doing a lot more in class   Time 3   Period Weeks   Status Partially Met           PT Long Term Goals - 07/24/17 1216      PT LONG TERM GOAL #1   Title Pt will be able to squat down with </= 2/10 L knee pain   Baseline able to do   Time 6   Period Weeks   Status Achieved     PT LONG TERM GOAL #2   Title Pt will demo improved L knee strength per MMT >/= 1 MMT grade to assist with decreased L patella dislocations with activities   Baseline 5/5 left knee mmt    Time 6   Period Weeks   Status Achieved     PT LONG TERM GOAL #3   Title Pt will be able to demo improved lumbar/core strength to good + to assist with work duties   Baseline able to demo good core contraction.  Specific core strength tasks not assessed.   Time 6   Period Weeks   Status Partially Met     PT LONG TERM GOAL #4   Title Pt will report 75% less LBP with work duties   Baseline Pain with work duties is improving.  Patient did not give % of improvement.   Time 6   Period Weeks   Status Partially Met               Plan - 07/24/17 1222    Clinical Impression Statement This was patient's last session today.  She has met or partially met her goals.  Extra time spent on education with posture, book bag and how to get exercises completed on a busy work/ school day.  She really needs a different book bag,  one with wheels or perhaps one with springs. (New)   PT Next Visit Plan d/c   PT Home Exercise Plan hip abduciton/adduction, SLR, hamstring & gastroc stretch, bridge with tband; dead bug, bird dog, primal push up; Pilates  clam and scissors, isometric abdominals   Consulted and Agree with Plan of Care Patient      Patient will benefit from skilled therapeutic intervention in order to improve the following deficits and impairments:     Visit Diagnosis: Chronic pain of left knee  Muscle weakness (generalized)  Chronic midline low back pain without sciatica  Pain in thoracic spine  Abnormal posture  Knee instability, unspecified laterality  Weakness of both lower extremities     Problem List Patient Active Problem List   Diagnosis Date Noted  . Scalp itch 04/18/2017  . Encounter for vision screening 07/07/2016  . 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 Frequency of urination 12/03/2015  . Screening 12/03/2015  . Migraine without aura and without status migrainosus, not intractable 12/03/2015  . Hammer toe 11/03/2015  . Epigastric pain 08/25/2014  .  ADHD (attention deficit hyperactivity disorder) 07/03/2013  HARRIS,KAREN PTA 07/24/2017, 12:27 PM  [facility] [address] [place], [place], [address] Phone: [phone]   Fax:  [phone]  Name: MARVELINE PROFETA MRN: [id number] Date of Birth: [birth year]

## 2017-07-25 ENCOUNTER — Encounter: Payer: Medicaid Other | Admitting: Physical Therapy

## 2017-08-01 ENCOUNTER — Encounter: Payer: Medicaid Other | Admitting: Physical Therapy

## 2017-08-02 ENCOUNTER — Encounter: Payer: Medicaid Other | Admitting: Physical Therapy

## 2017-08-09 ENCOUNTER — Encounter (INDEPENDENT_AMBULATORY_CARE_PROVIDER_SITE_OTHER): Payer: Self-pay | Admitting: Physician Assistant

## 2017-08-09 ENCOUNTER — Ambulatory Visit (INDEPENDENT_AMBULATORY_CARE_PROVIDER_SITE_OTHER): Payer: Medicaid Other | Admitting: Physician Assistant

## 2017-08-09 VITALS — BP 113/73 | HR 79 | Temp 98.1°F | Ht 67.5 in | Wt 228.4 lb

## 2017-08-09 DIAGNOSIS — Z0000 Encounter for general adult medical examination without abnormal findings: Secondary | ICD-10-CM

## 2017-08-09 DIAGNOSIS — Z23 Encounter for immunization: Secondary | ICD-10-CM

## 2017-08-09 NOTE — Progress Notes (Signed)
Subjective:  Patient ID: Hannah Palmer, female    DOB: 06-26-1996  Age: 21 y.o. MRN: [id number]  CC: annual exam  HPI Hannah Palmer is a 21 y.o. female with a medical history of ADHD, depression, and migraine headaches presents for an annual physical. Does not have any complaints or symptoms besides chronic constipation. Has some laxatives at home which she has not used yet. Can not go out to buy any more drugs/prescriptions due to lack of money. Works three jobs to get herself through school.        Outpatient Medications Prior to Visit  Medication Sig Dispense Refill  . amphetamine-dextroamphetamine (ADDERALL XR) 10 MG 24 hr capsule Take 10 mg by mouth daily. Take between noon and 2 pm daily    . amphetamine-dextroamphetamine (ADDERALL XR) 30 MG 24 hr capsule Take 1 capsule (30 mg total) by mouth daily with breakfast. 30 capsule 0  . citalopram (CELEXA) 20 MG tablet Take 20 mg by mouth.    . fluticasone (FLONASE) 50 MCG/ACT nasal spray Place 1 spray into the nose.    Marland Kitchen GUANFACINE HCL PO Take 1 tablet by mouth daily.    . ranitidine (ZANTAC 75) 75 MG tablet Take 1 tablet (75 mg total) by mouth 2 (two) times daily. 30 tablet 0  . Selenium Sulfide 2.25 % SHAM Apply 1 application topically daily. 1 Bottle 1  . topiramate (TOPAMAX) 50 MG tablet Take 1 tablet (50 mg total) by mouth 2 (two) times daily. 60 tablet 12  . acetaminophen (TYLENOL) 325 MG tablet Take 650 mg by mouth every 6 (six) hours as needed.    . Aspirin-Acetaminophen-Caffeine (EXCEDRIN EXTRA STRENGTH PO) Take by mouth. As needed    . clotrimazole (LOTRIMIN) 1 % cream Apply 1 application topically 2 (two) times daily. (Patient not taking: Reported on 08/09/2017) 15 g 0  . diclofenac (CATAFLAM) 50 MG tablet Take 1 tablet (50 mg total) by mouth 3 (three) times daily. (Patient not taking: Reported on 08/09/2017) 90 tablet 0  . sodium chloride (OCEAN) 0.65 % SOLN nasal spray Place 2 sprays into both nostrils as needed  for congestion. 1 Bottle 1   Facility-Administered Medications Prior to Visit  Medication Dose Route Frequency Provider Last Rate Last Dose  . triamcinolone cream (KENALOG) 0.1 %   Topical BID Clent Demark, PA-C         ROS Review of Systems  Constitutional: Negative for chills, fever and malaise/fatigue.  Eyes: Negative for blurred vision.  Respiratory: Negative for shortness of breath.   Cardiovascular: Negative for chest pain and palpitations.  Gastrointestinal: Negative for abdominal pain and nausea.  Genitourinary: Negative for dysuria and hematuria.  Musculoskeletal: Negative for joint pain and myalgias.  Skin: Negative for rash.  Neurological: Negative for tingling and headaches.  Psychiatric/Behavioral: Negative for depression. The patient is not nervous/anxious.     Objective:  BP 113/73 (BP Location: Left Arm, Patient Position: Sitting, Cuff Size: Large)   Pulse 79   Temp 98.1 F (36.7 C) (Oral)   Ht 5' 7.5" (1.715 m)   Wt 228 lb 6.4 oz (103.6 kg)   SpO2 96%   BMI 35.24 kg/m   BP/Weight 08/09/2017 06/27/4817 03/19/3148  Systolic BP 702 637 858  Diastolic BP 73 72 70  Wt. (Lbs) 228.4 228 233.4  BMI 35.24 34.67 35.49  Some encounter information is confidential and restricted. Go to Review Flowsheets activity to see all data.      Physical Exam  Constitutional:  She is oriented to person, place, and time.  Well developed, well nourished, NAD, polite  HENT:  Head: Normocephalic and atraumatic.  Eyes: Conjunctivae are normal. No scleral icterus.  Neck: Normal range of motion. Neck supple. No thyromegaly present.  Cardiovascular: Normal rate, regular rhythm and normal heart sounds.   Pulmonary/Chest: Effort normal and breath sounds normal. No respiratory distress. She has no wheezes. She has no rales.  Abdominal: Soft. Bowel sounds are normal. She exhibits no distension and no mass. There is no tenderness. There is no rebound and no guarding.   Musculoskeletal: She exhibits no edema.  LEs, UEs, and Back with full aROM  Neurological: She is alert and oriented to person, place, and time. No cranial nerve deficit. Coordination normal.  Skin: Skin is warm and dry. No rash noted. No erythema. No pallor.  Psychiatric: She has a normal mood and affect. Her behavior is normal. Thought content normal.  Vitals reviewed.    Assessment & Plan:     1. Annual physical exam - Comprehensive metabolic panel - CBC with Differential - Lipid Panel  2. Need for prophylactic vaccination and inoculation against influenza - Flu Vaccine QUAD 6+ mos PF IM (Fluarix Quad PF)   Follow-up: Return if symptoms worsen or fail to improve.   Clent Demark PA

## 2017-08-09 NOTE — Patient Instructions (Signed)

## 2017-08-10 ENCOUNTER — Telehealth (INDEPENDENT_AMBULATORY_CARE_PROVIDER_SITE_OTHER): Payer: Self-pay

## 2017-08-10 LAB — CBC WITH DIFFERENTIAL/PLATELET
BASOS: 0 %
Basophils Absolute: 0 10*3/uL (ref 0.0–0.2)
EOS (ABSOLUTE): 0.1 10*3/uL (ref 0.0–0.4)
EOS: 1 %
HEMATOCRIT: 40.4 % (ref 34.0–46.6)
HEMOGLOBIN: 13.2 g/dL (ref 11.1–15.9)
Immature Grans (Abs): 0 10*3/uL (ref 0.0–0.1)
Immature Granulocytes: 0 %
LYMPHS ABS: 2.6 10*3/uL (ref 0.7–3.1)
Lymphs: 33 %
MCH: 28 pg (ref 26.6–33.0)
MCHC: 32.7 g/dL (ref 31.5–35.7)
MCV: 86 fL (ref 79–97)
MONOCYTES: 9 %
Monocytes Absolute: 0.7 10*3/uL (ref 0.1–0.9)
Neutrophils Absolute: 4.5 10*3/uL (ref 1.4–7.0)
Neutrophils: 57 %
Platelets: 393 10*3/uL — ABNORMAL HIGH (ref 150–379)
RBC: 4.71 x10E6/uL (ref 3.77–5.28)
RDW: 14.2 % (ref 12.3–15.4)
WBC: 7.9 10*3/uL (ref 3.4–10.8)

## 2017-08-10 LAB — COMPREHENSIVE METABOLIC PANEL
ALT: 13 IU/L (ref 0–32)
AST: 20 IU/L (ref 0–40)
Albumin/Globulin Ratio: 1.8 (ref 1.2–2.2)
Albumin: 4.5 g/dL (ref 3.5–5.5)
Alkaline Phosphatase: 127 IU/L — ABNORMAL HIGH (ref 39–117)
BUN/Creatinine Ratio: 13 (ref 9–23)
BUN: 11 mg/dL (ref 6–20)
Bilirubin Total: <0.2 mg/dL (ref 0.0–1.2)
CALCIUM: 9.7 mg/dL (ref 8.7–10.2)
CO2: 23 mmol/L (ref 20–29)
CREATININE: 0.84 mg/dL (ref 0.57–1.00)
Chloride: 105 mmol/L (ref 96–106)
GFR calc Af Amer: 116 mL/min/{1.73_m2} (ref >59)
GFR, EST NON AFRICAN AMERICAN: 100 mL/min/{1.73_m2} (ref >59)
GLUCOSE: 88 mg/dL (ref 65–99)
Globulin, Total: 2.5 g/dL (ref 1.5–4.5)
Potassium: 4.2 mmol/L (ref 3.5–5.2)
Sodium: 142 mmol/L (ref 134–144)
TOTAL PROTEIN: 7 g/dL (ref 6.0–8.5)

## 2017-08-10 LAB — LIPID PANEL
CHOL/HDL RATIO: 5.1 ratio — AB (ref 0.0–4.4)
Cholesterol, Total: 220 mg/dL — ABNORMAL HIGH (ref 100–199)
HDL: 43 mg/dL (ref >39)
LDL CALC: 144 mg/dL — AB (ref 0–99)
TRIGLYCERIDES: 167 mg/dL — AB (ref 0–149)
VLDL Cholesterol Cal: 33 mg/dL (ref 5–40)

## 2017-08-10 NOTE — Telephone Encounter (Signed)
Left patient a detailed message with lab results, and asked her to call the office with any questions. Nat Christen, CMA

## 2017-08-10 NOTE — Telephone Encounter (Signed)
-----   Message from Clent Demark, PA-C sent at 08/10/2017  8:31 AM EDT ----- Triglycerides are mildly high and LDL is moderately high. Please watch intake of sweets and fats. Take OTC fish oil pills.

## 2017-09-20 ENCOUNTER — Ambulatory Visit: Payer: Medicaid Other | Admitting: Diagnostic Neuroimaging

## 2017-09-20 ENCOUNTER — Encounter: Payer: Self-pay | Admitting: Diagnostic Neuroimaging

## 2017-09-20 VITALS — BP 123/71 | HR 75 | Ht 67.5 in | Wt 227.8 lb

## 2017-09-20 DIAGNOSIS — G43009 Migraine without aura, not intractable, without status migrainosus: Secondary | ICD-10-CM | POA: Diagnosis not present

## 2017-09-20 MED ORDER — TOPIRAMATE 50 MG PO TABS: 50.0000 mg | ORAL_TABLET | Freq: Two times a day (BID) | ORAL | 4 refills | Status: DC

## 2017-09-20 NOTE — Progress Notes (Signed)
[facility]  PATIENT: Hannah Palmer DOB: 1996-05-30  REFERRING CLINICIAN: Leafy Ro, S HISTORY FROM: patient  REASON FOR VISIT: follow up    HISTORICAL  CHIEF COMPLAINT:  Chief Complaint  Patient presents with  . Follow-up  . Migraine    doing well.      HISTORY OF PRESENT ILLNESS:   UPDATE (09/20/17, VRP): Since last visit, doing well. Tolerating TPX. Avg 3 HA per month. Usually aggravated by dehydration.   UPDATE 09/20/16: Since last visit, still on TPX 30m twice a day. HA are well controlled.   UPDATE 03/01/16: Since last visit, on TPX ([id number]) and doing a little better. Having 3-4 migraine per month. Having 5 tension HA per month.   UPDATE 12/03/15: Since last visit, HA are stable. Avg 8 migraine per month; 2-4 tension HA per month. MRI brain unremarkable. Anxiety and insomnia are still issues.  PRIOR HPI (10/16/15): [age]ear old left-handed female here for evaluation of headaches. For past 1-2 years patient has had onset of 2 types of headaches. She describes one as a bandlike squeezing tension headache which is intermittent. This has somewhat resolved over the last few months. However in other type of headache has been present and gradually been increasing. She describes this as a throbbing stabbing severe headache with eye pain, unilateral right or left side, associated with nausea, photophobia and phonophobia. These headaches can last hours at a time. She is averaging 1-2 of the severe headaches per week. Typically she has to lay down in a dark quiet room and wait for symptoms to resolve. August 2015 patient was also having intermittent episodes of dizziness, clamminess, sweating, motion sickness. More recently she went to ENT for evaluation of these dizzy spells and was found to have no specific ear related pathology. She was advised to come to neurology for further evaluation of possible migraine phenomenon. Patient has family history of migraine in  her maternal uncle and aunt.   REVIEW OF SYSTEMS: Full 14 system review of systems performed and negative except:   Review of Systems  HENT: Positive for tinnitus.        Runny nose  Eyes: Positive for double vision and photophobia.  Gastrointestinal: Positive for constipation and diarrhea.  Musculoskeletal: Positive for back pain.  Neurological: Positive for dizziness.       Headache  Psychiatric/Behavioral: Positive for depression. The patient is nervous/anxious.        Hyperactive, decr concentration   ALLERGIES: No Known Allergies  HOME MEDICATIONS: Outpatient Medications Prior to Visit  Medication Sig Dispense Refill  . acetaminophen (TYLENOL) 325 MG tablet Take 650 mg by mouth every 6 (six) hours as needed.    . Marland Kitchenmphetamine-dextroamphetamine (ADDERALL XR) 10 MG 24 hr capsule Take 10 mg by mouth daily. Take between noon and 2 pm daily    . amphetamine-dextroamphetamine (ADDERALL XR) 30 MG 24 hr capsule Take 1 capsule (30 mg total) by mouth daily with breakfast. 30 capsule 0  . Aspirin-Acetaminophen-Caffeine (EXCEDRIN EXTRA STRENGTH PO) Take by mouth. As needed    . citalopram (CELEXA) 20 MG tablet Take 20 mg by mouth.    . clotrimazole (LOTRIMIN) 1 % cream Apply 1 application topically 2 (two) times daily. 15 g 0  . diclofenac (CATAFLAM) 50 MG tablet Take 1 tablet (50 mg total) by mouth 3 (three) times daily. 90 tablet 0  . fluticasone (FLONASE) 50 MCG/ACT nasal spray Place 1 spray into the nose.    . Marland KitchenUANFACINE HCL PO Take 1 tablet  by mouth daily.    . ranitidine (ZANTAC 75) 75 MG tablet Take 1 tablet (75 mg total) by mouth 2 (two) times daily. 30 tablet 0  . Selenium Sulfide 2.25 % SHAM Apply 1 application topically daily. 1 Bottle 1  . topiramate (TOPAMAX) 50 MG tablet Take 1 tablet (50 mg total) by mouth 2 (two) times daily. 60 tablet 12  . sodium chloride (OCEAN) 0.65 % SOLN nasal spray Place 2 sprays into both nostrils as needed for congestion. 1 Bottle 1    Facility-Administered Medications Prior to Visit  Medication Dose Route Frequency Provider Last Rate Last Dose  . triamcinolone cream (KENALOG) 0.1 %   Topical BID Clent Demark, PA-C        PAST MEDICAL HISTORY: Past Medical History:  Diagnosis Date  . ADHD (attention deficit hyperactivity disorder)   . Allergy   . Depression   . Migraine     PAST SURGICAL HISTORY: Past Surgical History:  Procedure Laterality Date  . ADENOIDECTOMY  2001  . Base Wedge Osteotomy Right 02/27/2014   @ [facility]  . OSTEOTOMY Right 02/27/2014   Met Head Rt #5 @ [facility]  . TONSILLECTOMY  Age 38  . TYMPANOSTOMY TUBE PLACEMENT      FAMILY HISTORY: Family History  Problem Relation Age of Onset  . Bipolar disorder Mother   . Stroke Mother   . ADD / ADHD Father   . Depression Maternal Grandmother   . Hypertension Maternal Grandmother   . Hyperlipidemia Maternal Grandmother   . Heart disease Maternal Grandmother   . Diabetes Maternal Grandmother   . Arthritis Maternal Grandmother   . Skin cancer Maternal Grandmother   . Diabetes Maternal Grandfather   . Stroke Maternal Grandfather   . Colon cancer Maternal Grandfather   . Diabetes Paternal Grandmother   . Diabetes Paternal Grandfather   . Bipolar disorder Maternal Aunt   . Stroke Maternal Aunt     SOCIAL HISTORY:  Social History   Socioeconomic History  . Marital status: Single    Spouse name: Not on file  . Number of children: 0  . Years of education: Ship broker  . Highest education level: Not on file  Social Needs  . Financial resource strain: Not on file  . Food insecurity - worry: Not on file  . Food insecurity - inability: Not on file  . Transportation needs - medical: Not on file  . Transportation needs - non-medical: Not on file  Occupational History  . Occupation: Lexicographer: UNEMPLOYED    Comment: [employer]  Tobacco Use  . Smoking status: Passive Smoke Exposure - Never Smoker  . Smokeless tobacco: Never Used   Substance and Sexual Activity  . Alcohol use: No  . Drug use: No  . Sexual activity: No    Partners: Male    Birth control/protection: Injection  Other Topics Concern  . Not on file  Social History Narrative   Patient lives at home with her grandmother on the weekends. Lives in [place] during the week.   Caffeine Use: 1 cup daily      PHYSICAL EXAM  GENERAL EXAM/CONSTITUTIONAL: Vitals:  Vitals:   09/20/17 1533  BP: 123/71  Pulse: 75  Weight: 227 lb 12.8 oz (103.3 kg)  Height: 5' 7.5" (1.715 m)   Wt Readings from Last 3 Encounters:  09/20/17 227 lb 12.8 oz (103.3 kg)  08/09/17 228 lb 6.4 oz (103.6 kg)  07/10/17 228 lb (103.4 kg)  Body mass index is 35.15 kg/m. No exam data present  Patient is in no distress; well developed, nourished and groomed; neck is supple  CARDIOVASCULAR:  Examination of carotid arteries is normal; no carotid bruits  Regular rate and rhythm, no murmurs  Examination of peripheral vascular system by observation and palpation is normal  EYES:  Ophthalmoscopic exam of optic discs and posterior segments is normal; no papilledema or hemorrhages  MUSCULOSKELETAL:  Gait, strength, tone, movements noted in Neurologic exam below  NEUROLOGIC: MENTAL STATUS:  No flowsheet data found.  awake, alert, oriented to person, place and time  recent and remote memory intact  normal attention and concentration  language fluent, comprehension intact, naming intact,   fund of knowledge appropriate  CRANIAL NERVE:   2nd - no papilledema on fundoscopic exam  2nd, 3rd, 4th, 6th - pupils equal and reactive to light, visual fields full to confrontation, extraocular muscles intact, no nystagmus  5th - facial sensation symmetric  7th - facial strength symmetric  8th - hearing intact  9th - palate elevates symmetrically, uvula midline  11th - shoulder shrug symmetric  12th - tongue protrusion midline  MOTOR:   normal bulk and tone,  full strength in the BUE, BLE  SENSORY:   normal and symmetric to light touch, temperature, vibration   COORDINATION:   finger-nose-finger, fine finger movements normal  REFLEXES:   deep tendon reflexes present and symmetric  GAIT/STATION:   narrow based gait; romberg is negative    DIAGNOSTIC DATA (LABS, IMAGING, TESTING) - I reviewed patient records, labs, notes, testing and imaging myself where available.  Lab Results  Component Value Date   WBC 7.9 08/09/2017   HGB 13.2 08/09/2017   HCT 40.4 08/09/2017   MCV 86 08/09/2017   PLT 393 (H) 08/09/2017      Component Value Date/Time   NA 142 08/09/2017 1633   K 4.2 08/09/2017 1633   CL 105 08/09/2017 1633   CO2 23 08/09/2017 1633   GLUCOSE 88 08/09/2017 1633   GLUCOSE 84 12/08/2012 2222   BUN 11 08/09/2017 1633   CREATININE 0.84 08/09/2017 1633   CALCIUM 9.7 08/09/2017 1633   PROT 7.0 08/09/2017 1633   ALBUMIN 4.5 08/09/2017 1633   AST 20 08/09/2017 1633   ALT 13 08/09/2017 1633   ALKPHOS 127 (H) 08/09/2017 1633   BILITOT <0.2 08/09/2017 1633   GFRNONAA 100 08/09/2017 1633   GFRAA 116 08/09/2017 1633   Lab Results  Component Value Date   CHOL 220 (H) 08/09/2017   HDL 43 08/09/2017   LDLCALC 144 (H) 08/09/2017   TRIG 167 (H) 08/09/2017   CHOLHDL 5.1 (H) 08/09/2017   Lab Results  Component Value Date   HGBA1C 5.1 07/10/2017   No results found for: VITAMINB12 Lab Results  Component Value Date   TSH 1.030 12/10/2012    10/20/15 MRI brain [I reviewed images myself and agree with interpretation. -VRP]  - normal    ASSESSMENT AND PLAN  21 y.o. year old female here with 1-2 year history of mixed headaches with tension headaches and migraine without aura. Topiramate helping.    Dx:  Migraine without aura and without status migrainosus, not intractable    PLAN:   I spent 15 minutes of face to face time with patient. Greater than 50% of time was spent in counseling and coordination of care with  patient. In summary we discussed:   - continue topiramate 11m twice a day - if headaches worsen, may consider  migraine rescue meds (patient wants to hold off for now) - continue to optimize nutrition, physical activity, stress mgmt and sleep - follow up with PCP for now; may return here as needed  Meds ordered this encounter  Medications  . topiramate (TOPAMAX) 50 MG tablet    Sig: Take 1 tablet (50 mg total) 2 (two) times daily by mouth.    Dispense:  180 tablet    Refill:  4   Return if symptoms worsen or fail to improve, for return to PCP.    Penni Bombard, MD 11/20/107, 3:23 PM Certified in Neurology, Neurophysiology and Neuroimaging  [facility] [address] [place], [place] [address] [phone]

## 2017-10-12 ENCOUNTER — Other Ambulatory Visit: Payer: Self-pay | Admitting: Diagnostic Neuroimaging

## 2017-10-16 ENCOUNTER — Ambulatory Visit (INDEPENDENT_AMBULATORY_CARE_PROVIDER_SITE_OTHER): Payer: Medicaid Other | Admitting: Podiatry

## 2017-10-16 DIAGNOSIS — M205X1 Other deformities of toe(s) (acquired), right foot: Secondary | ICD-10-CM | POA: Diagnosis not present

## 2017-10-16 NOTE — Patient Instructions (Signed)
Pre-Operative Instructions  Congratulations, you have decided to take an important step towards improving your quality of life.  You can be assured that the doctors and staff at [facility] will be with you every step of the way.  Here are some important things you should know:  1. Plan to be at the surgery center/hospital at least 1 (one) hour prior to your scheduled time, unless otherwise directed by the surgical center/hospital staff.  You must have a responsible adult accompany you, remain during the surgery and drive you home.  Make sure you have directions to the surgical center/hospital to ensure you arrive on time. 2. If you are having surgery at [facility] or [facility] hospitals, you will need a copy of your medical history and physical form from your family physician within one month prior to the date of surgery. We will give you a form for your primary physician to complete.  3. We make every effort to accommodate the date you request for surgery.  However, there are times where surgery dates or times have to be moved.  We will contact you as soon as possible if a change in schedule is required.   4. No aspirin/ibuprofen for one week before surgery.  If you are on aspirin, any non-steroidal anti-inflammatory medications (Mobic, Aleve, Ibuprofen) should not be taken seven (7) days prior to your surgery.  You make take Tylenol for pain prior to surgery.  5. Medications - If you are taking daily heart and blood pressure medications, seizure, reflux, allergy, asthma, anxiety, pain or diabetes medications, make sure you notify the surgery center/hospital before the day of surgery so they can tell you which medications you should take or avoid the day of surgery. 6. No food or drink after midnight the night before surgery unless directed otherwise by surgical center/hospital staff. 7. No alcoholic beverages 24-hours prior to surgery.  No smoking 24-hours prior or 24-hours after  surgery. 8. Wear loose pants or shorts. They should be loose enough to fit over bandages, boots, and casts. 9. Don't wear slip-on shoes. Sneakers are preferred. 10. Bring your boot with you to the surgery center/hospital.  Also bring crutches or a walker if your physician has prescribed it for you.  If you do not have this equipment, it will be provided for you after surgery. 11. If you have not been contacted by the surgery center/hospital by the day before your surgery, call to confirm the date and time of your surgery. 12. Leave-time from work may vary depending on the type of surgery you have.  Appropriate arrangements should be made prior to surgery with your employer. 13. Prescriptions will be provided immediately following surgery by your doctor.  Fill these as soon as possible after surgery and take the medication as directed. Pain medications will not be refilled on weekends and must be approved by the doctor. 14. Remove nail polish on the operative foot and avoid getting pedicures prior to surgery. 15. Wash the night before surgery.  The night before surgery wash the foot and leg well with water and the antibacterial soap provided. Be sure to pay special attention to beneath the toenails and in between the toes.  Wash for at least three (3) minutes. Rinse thoroughly with water and dry well with a towel.  Perform this wash unless told not to do so by your physician.  Enclosed: 1 Ice pack (please put in freezer the night before surgery)   1 Hibiclens skin cleaner     Pre-op instructions  If you have any questions regarding the instructions, please do not hesitate to call our office.  [place]: [address], [place], [place] [address] -- [phone]  [place]: [address] [place], [place] [address] -- [phone]  [place]: [address]  [place], [place] [address] -- [phone]  [place]: [address], [address], [place], [place] [address] -- [phone]  Website:  [email or web address] 

## 2017-10-18 ENCOUNTER — Ambulatory Visit (INDEPENDENT_AMBULATORY_CARE_PROVIDER_SITE_OTHER): Payer: Medicaid Other | Admitting: Physician Assistant

## 2017-10-18 ENCOUNTER — Other Ambulatory Visit: Payer: Self-pay

## 2017-10-18 ENCOUNTER — Encounter (INDEPENDENT_AMBULATORY_CARE_PROVIDER_SITE_OTHER): Payer: Self-pay | Admitting: Physician Assistant

## 2017-10-18 VITALS — BP 104/70 | HR 97 | Temp 99.0°F | Wt 222.2 lb

## 2017-10-18 DIAGNOSIS — L299 Pruritus, unspecified: Secondary | ICD-10-CM

## 2017-10-18 DIAGNOSIS — L210 Seborrhea capitis: Secondary | ICD-10-CM

## 2017-10-18 DIAGNOSIS — H60392 Other infective otitis externa, left ear: Secondary | ICD-10-CM | POA: Diagnosis not present

## 2017-10-18 MED ORDER — SELENIUM SULFIDE 2.25 % EX SHAM: 1.0000 "application " | MEDICATED_SHAMPOO | Freq: Every day | CUTANEOUS | 0 refills | Status: DC

## 2017-10-18 MED ORDER — TRIAMCINOLONE ACETONIDE 0.1 % EX CREA: 1.0000 "application " | TOPICAL_CREAM | Freq: Two times a day (BID) | CUTANEOUS | 0 refills | Status: DC

## 2017-10-18 MED ORDER — NEOMYCIN-POLYMYXIN-HC 1 % OT SOLN: 3.0000 [drp] | Freq: Four times a day (QID) | OTIC | 0 refills | Status: DC

## 2017-10-18 NOTE — Patient Instructions (Signed)
Otitis Externa Otitis externa is an infection of the outer ear canal. The outer ear canal is the area between the outside of the ear and the eardrum. Otitis externa is sometimes called "swimmer's ear." Follow these instructions at home:  If you were given antibiotic ear drops, use them as told by your doctor. Do not stop using them even if your condition gets better.  Take over-the-counter and prescription medicines only as told by your doctor.  Keep all follow-up visits as told by your doctor. This is important. How is this prevented?  Keep your ear dry. Use the corner of a towel to dry your ear after you swim or bathe.  Try not to scratch or put things in your ear. Doing these things makes it easier for germs to grow in your ear.  Avoid swimming in lakes, dirty water, or pools that may not have the right amount of a chemical called chlorine.  Consider making ear drops and putting 3 or 4 drops in each ear after you swim. Ask your doctor about how you can make ear drops. Contact a doctor if:  You have a fever.  After 3 days your ear is still red, swollen, or painful.  After 3 days you still have pus coming from your ear.  Your redness, swelling, or pain gets worse.  You have a really bad headache.  You have redness, swelling, pain, or tenderness behind your ear. This information is not intended to replace advice given to you by your health care provider. Make sure you discuss any questions you have with your health care provider. Document Released: 04/18/2008 Document Revised: 11/26/2015 Document Reviewed: 08/10/2015 Elsevier Interactive Patient Education  2018 Elsevier Inc.  

## 2017-10-18 NOTE — Progress Notes (Signed)
Subjective:  Patient ID: Hannah Palmer, female    DOB: Apr 15, 1996  Age: 21 y.o. MRN: [id number]  CC:   left ear pain   HPI Hannah Palmer is a 21 y.o. female with a medical history of ADHD, depression, and migraines presents with 3 week onset of left ear canal pain. Has felt and seen a whitish/clear discharge from the ear. Was ill with an upper respiratory infection for one week but says the ear pain preceded the URI. URI has self resolved. Complains of a generalized headache over the last few weeks. Described as a "rubber band" around her head. Currently with mild headache. Has not taken anything for relief. No close contacts with the same. Denies constitutional symptoms. Requests refill for occasional scalp itch and dandruff.       Outpatient Medications Prior to Visit  Medication Sig Dispense Refill  . acetaminophen (TYLENOL) 325 MG tablet Take 650 mg by mouth every 6 (six) hours as needed.    Marland Kitchen amphetamine-dextroamphetamine (ADDERALL XR) 10 MG 24 hr capsule Take 10 mg by mouth daily. Take between noon and 2 pm daily    . amphetamine-dextroamphetamine (ADDERALL XR) 30 MG 24 hr capsule Take 1 capsule (30 mg total) by mouth daily with breakfast. 30 capsule 0  . Aspirin-Acetaminophen-Caffeine (EXCEDRIN EXTRA STRENGTH PO) Take by mouth. As needed    . citalopram (CELEXA) 20 MG tablet Take 20 mg by mouth.    . clotrimazole (LOTRIMIN) 1 % cream Apply 1 application topically 2 (two) times daily. 15 g 0  . diclofenac (CATAFLAM) 50 MG tablet Take 1 tablet (50 mg total) by mouth 3 (three) times daily. 90 tablet 0  . fluticasone (FLONASE) 50 MCG/ACT nasal spray Place 1 spray into the nose.    Marland Kitchen GUANFACINE HCL PO Take 1 tablet by mouth daily.    . ranitidine (ZANTAC 75) 75 MG tablet Take 1 tablet (75 mg total) by mouth 2 (two) times daily. 30 tablet 0  . Selenium Sulfide 2.25 % SHAM Apply 1 application topically daily. 1 Bottle 1  . topiramate (TOPAMAX) 50 MG tablet Take 1 tablet (50  mg total) 2 (two) times daily by mouth. 180 tablet 4  . sodium chloride (OCEAN) 0.65 % SOLN nasal spray Place 2 sprays into both nostrils as needed for congestion. 1 Bottle 1   Facility-Administered Medications Prior to Visit  Medication Dose Route Frequency Provider Last Rate Last Dose  . triamcinolone cream (KENALOG) 0.1 %   Topical BID Clent Demark, PA-C         ROS Review of Systems  Constitutional: Negative for chills, fever and malaise/fatigue.  HENT: Positive for congestion, ear discharge and ear pain. Negative for sinus pain and sore throat.   Eyes: Negative for blurred vision and pain.  Respiratory: Negative for shortness of breath.   Cardiovascular: Negative for chest pain and palpitations.  Gastrointestinal: Positive for constipation (occasionally). Negative for abdominal pain and nausea.  Genitourinary: Negative for dysuria and hematuria.  Musculoskeletal: Negative for joint pain and myalgias.  Skin: Negative for rash.  Neurological: Negative for tingling and headaches.  Psychiatric/Behavioral: Negative for depression. The patient is not nervous/anxious.     Objective:  BP 104/70 (BP Location: Left Arm, Patient Position: Sitting, Cuff Size: Large)   Pulse 97   Temp 99 F (37.2 C) (Oral)   Wt 222 lb 3.2 oz (100.8 kg)   LMP 10/04/2017 (Approximate)   SpO2 99%   BMI 34.29 kg/m   BP/Weight 10/18/2017  09/20/2017 07/01/2992  Systolic BP 716 967 893  Diastolic BP 70 71 73  Wt. (Lbs) 222.2 227.8 228.4  BMI 34.29 35.15 35.24  Some encounter information is confidential and restricted. Go to Review Flowsheets activity to see all data.      Physical Exam  Constitutional: She is oriented to person, place, and time.  Well developed, overweight, NAD, polite  HENT:  Head: Normocephalic and atraumatic.  Left ear canal mildly erythematous, white waxy appearing material abutting the TM. Small, white, and domed papule in proximity to the TM.   Eyes: No scleral icterus.   Neck: Normal range of motion. Neck supple. No thyromegaly present.  Cardiovascular: Normal rate, regular rhythm and normal heart sounds.  Pulmonary/Chest: Effort normal and breath sounds normal.  Abdominal: Soft. Bowel sounds are normal. There is no tenderness.  Musculoskeletal: She exhibits no edema.  Neurological: She is alert and oriented to person, place, and time.  Skin: Skin is warm and dry. No rash noted. No erythema. No pallor.  Psychiatric: She has a normal mood and affect. Her behavior is normal. Thought content normal.  Vitals reviewed.    Assessment & Plan:   1. Infective otitis externa of left ear - NEOMYCIN-POLYMYXIN-HYDROCORTISONE (CORTISPORIN) 1 % SOLN OTIC solution; Place 3 drops into the left ear 4 (four) times daily.  Dispense: 10 mL; Refill: 0  2. Scalp itch - triamcinolone cream (KENALOG) 0.1 %; Apply 1 application topically 2 (two) times daily. Do not use for more than 7 consecutive days.  Dispense: 30 g; Refill: 0  3. Dandruff - Selenium Sulfide 2.25 % SHAM; Apply 1 application topically daily.  Dispense: 180 mL; Refill: 0   Meds ordered this encounter  Medications  . NEOMYCIN-POLYMYXIN-HYDROCORTISONE (CORTISPORIN) 1 % SOLN OTIC solution    Sig: Place 3 drops into the left ear 4 (four) times daily.    Dispense:  10 mL    Refill:  0    Order Specific Question:   Supervising Provider    Answer:   Tresa Garter [id number]  . triamcinolone cream (KENALOG) 0.1 %    Sig: Apply 1 application topically 2 (two) times daily. Do not use for more than 7 consecutive days.    Dispense:  30 g    Refill:  0    Order Specific Question:   Supervising Provider    Answer:   Tresa Garter [id number]  . Selenium Sulfide 2.25 % SHAM    Sig: Apply 1 application topically daily.    Dispense:  180 mL    Refill:  0    Order Specific Question:   Supervising Provider    Answer:   Tresa Garter [id number]    Follow-up: Return if symptoms worsen or fail to  improve.   Clent Demark PA

## 2017-10-19 NOTE — Progress Notes (Signed)
Subjective: Tawana presents the office today for surgical consultation due to right second toe mallet toe deformity.  Due to her work schedule and school schedule she did not undergo surgery earlier this year at this point she wished to go ahead and proceed with the surgery.  She is attempted numerous conservative treatments including, but not limited to, shoe modifications, offloading padding.  She still gets pain to the right second toe on a daily basis especially with doing a lot of walking or standing or wearing regular shoes.  She denies any recent injury or trauma.  She denies any redness or warmth.  She has no other concerns today.  She previously did have an arthroplasty performed of the digits.  Unfortunately right second toe did recur.  Denies any systemic complaints such as fevers, chills, nausea, vomiting. No acute changes since last appointment, and no other complaints at this time.   Objective: AAO x3, NAD DP/PT pulses palpable bilaterally, CRT less than 3 seconds Mallet toe deformity is present the right second toe and there is tenderness the DIPJ of the right second digit.  This appears to be semirigid.  There is mild edema but there is no erythema or increase in warmth.  The remainder of the toes on the right side appear to be in rectus position there is no other areas of tenderness identified to bilateral lower extremities. No open lesions or pre-ulcerative lesions.  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  -Again we discussed both conservative as well as surgical treatment options.  At this point she wishes to proceed with surgical intervention.  Discussed with her DIPJ arthroplasty with K wire fixation.  We discussed this is not a guarantee resolution of symptoms and if it continue or she  could have worsening contracture.  She understands this and she wishes to proceed. -The incision placement as well as the postoperative course was discussed with the patient. I discussed risks of the surgery which include, but not limited to, infection, bleeding, pain, swelling, need for further surgery, delayed or nonhealing, painful or ugly scar, numbness or sensation changes, over/under correction, recurrence, transfer lesions, further deformity, hardware failure, DVT/PE, loss of toe/foot. Patient understands these risks and wishes to proceed with surgery. The surgical consent was reviewed with the patient all 3 pages were signed. No promises or guarantees were given to the outcome of the procedure. All questions were answered to the best of my ability. Before the surgery the patient was encouraged to call the office if there is any further questions. The surgery will be performed at the [facility] on an outpatient basis. -Patient encouraged to call the office with any questions, concerns, change in symptoms.   Trula Slade DPM

## 2017-10-25 ENCOUNTER — Telehealth: Payer: Self-pay | Admitting: *Deleted

## 2017-10-25 ENCOUNTER — Other Ambulatory Visit: Payer: Self-pay | Admitting: Podiatry

## 2017-10-25 ENCOUNTER — Encounter: Payer: Self-pay | Admitting: Podiatry

## 2017-10-25 DIAGNOSIS — M2041 Other hammer toe(s) (acquired), right foot: Secondary | ICD-10-CM | POA: Diagnosis not present

## 2017-10-25 MED ORDER — HYDROCODONE-ACETAMINOPHEN 5-325 MG PO TABS: 1.0000 | ORAL_TABLET | ORAL | 0 refills | Status: DC | PRN

## 2017-10-25 MED ORDER — PROMETHAZINE HCL 25 MG PO TABS: 25.0000 mg | ORAL_TABLET | Freq: Three times a day (TID) | ORAL | 0 refills | Status: DC | PRN

## 2017-10-25 MED ORDER — CEPHALEXIN 500 MG PO CAPS: 500.0000 mg | ORAL_CAPSULE | Freq: Three times a day (TID) | ORAL | 0 refills | Status: DC

## 2017-10-25 NOTE — Telephone Encounter (Signed)
Pt states she was told to call the office if the pain medication was not working. I spoke with pt and she describes the pain sensation as a throbbing and that she can feel the pins in her toes. I told pt that she needed to stay off the foot and not to have it below her heart for more than 15 minutes per hour or without the boot except if instructed. I instructed pt to sit down, remove the boot, open-ended sock, and the ace wrap and elevate the foot for 15 minutes, then lower the foot to the hip level and rewrap the ace looser, reapply sock and boot. Pt states the dressing is what is causing the problem. I told pt if she was able to take OTC Ibuprofen she could take 200mg  tablets 2 every 4-6 hours between the dosing of the Norco, and to continue to ice 15 mins/hour.

## 2017-10-25 NOTE — Progress Notes (Signed)
Resent prescriptions as the patients grandmother called and stated that the prescriptions were sent over. I called the pharmacy and confirmed that they got the rx.

## 2017-10-25 NOTE — Progress Notes (Signed)
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 Upon exam today the toe is sitting in a contracted position at the DIPJ and is semi-rigid and unable to fully reduce the deformity into a rectus position. Due to this discussed arthroplasty of the DIPJ with k-wire fixation. The surgical consent was reviewed with the patient and we discussed the procedure as well as the postoperative course. I again discussed all alternatives, risks, complications. I answered all of their questions to the best of my ability and they wish to proceed with surgery. No promises or guarantees were given as to the outcome of the surgery.   The surgical consent was signed.   Patient is NPO since midnight.  The patient does not have have a history of blood clots or bleeding disorders.   Post-op prescriptions sent to her pharmacy directly (Phenergan, Vicodin, Keflex)  No further questions.   Celesta Gentile, [place]

## 2017-10-26 ENCOUNTER — Telehealth: Payer: Self-pay | Admitting: Podiatry

## 2017-10-26 NOTE — Telephone Encounter (Signed)
I had surgery by Dr. Jacqualyn Posey yesterday. I stumped my surgical foot so I am calling like it says to do on the post-operative sheet. Also, I have uncomfortable pain on the top of my foot from where the bandages are. I couldn't quite understand the nurse yesterday when she explained how to check the bandages to loosen them and check for bleeding. If you could call me back at [phone]. Thank you.

## 2017-10-26 NOTE — Telephone Encounter (Signed)
I spoke with pt and instructed her not to be up on the foot more than 15 minutes/hour and transferred her to schedulers to get in to be seen tomorrow.

## 2017-10-27 ENCOUNTER — Encounter: Payer: Self-pay | Admitting: Podiatry

## 2017-10-27 ENCOUNTER — Ambulatory Visit (INDEPENDENT_AMBULATORY_CARE_PROVIDER_SITE_OTHER): Payer: Medicaid Other | Admitting: Podiatry

## 2017-10-27 ENCOUNTER — Telehealth: Payer: Self-pay | Admitting: *Deleted

## 2017-10-27 ENCOUNTER — Ambulatory Visit (INDEPENDENT_AMBULATORY_CARE_PROVIDER_SITE_OTHER): Payer: Medicaid Other

## 2017-10-27 DIAGNOSIS — M205X1 Other deformities of toe(s) (acquired), right foot: Secondary | ICD-10-CM

## 2017-10-27 NOTE — Telephone Encounter (Signed)
Called and left a message for the patient to call me and I stated that I was checking on her after the surgery on Wednesday 10/25/17 and to call [phone]. Lattie Haw

## 2017-10-30 ENCOUNTER — Encounter: Payer: Self-pay | Admitting: Podiatry

## 2017-10-30 ENCOUNTER — Telehealth: Payer: Self-pay | Admitting: *Deleted

## 2017-10-30 NOTE — Progress Notes (Signed)
Subjective: Hannah Palmer is a 21 y.o. is seen today in office s/p right second digit hammertoe preformed on 10/25/2017. They state their pain is minimal.  She presents today because she stopped her toe and she went to have area checked.  She has been wearing the surgical shoe.  She states that when she stubbed her toe she has an increase in pain but it quickly dissipated.  She presents today with her boyfriend.  Denies any systemic complaints such as fevers, chills, nausea, vomiting. No calf pain, chest pain, shortness of breath.   Objective: General: No acute distress, AAOx3  DP/PT pulses palpable 2/4, CRT < 3 sec to all digits.  Protective sensation intact. Motor function intact.  RIGHT foot: Incision is well coapted without any evidence of dehiscence and sutures are intact.  K wire intact as well without any drainage or pus.  There is no surrounding erythema, ascending cellulitis, fluctuance, crepitus, malodor, drainage/purulence. There is mild edema around the surgical site. There is minimal pain along the surgical site.  The toe was in rectus position. No other areas of tenderness to bilateral lower extremities.  No other open lesions or pre-ulcerative lesions.  No pain with calf compression, swelling, warmth, erythema.   Assessment and Plan:  Status post right second digit hammertoe repair, presents today because she stubbed her toe  -Treatment options discussed including all alternatives, risks, and complications -X-rays were obtained and reviewed.  Hardware intact.  Status post arthroplasty of the DIPJ.  There is no evidence of acute fracture identified. -Antibiotic ointment was applied followed by a bandage.  Keep the dressing clean, dry, intact -Surgical shoe. -Ice/elevation -Pain medication as needed. -Monitor for any clinical signs or symptoms of infection and DVT/PE and directed to call the office immediately should any occur or go to the ER. -Follow-up in 10 days or sooner  if any problems arise. In the meantime, encouraged to call the office with any questions, concerns, change in symptoms.   Celesta Gentile, DPM

## 2017-10-30 NOTE — Telephone Encounter (Signed)
Can do tylenol #3 1 tab PO q4 h prn pain disp #15

## 2017-10-30 NOTE — Telephone Encounter (Signed)
Pt called for refill of the pain medication.

## 2017-10-31 MED ORDER — ACETAMINOPHEN-CODEINE #3 300-30 MG PO TABS: 1.0000 | ORAL_TABLET | ORAL | 0 refills | Status: DC | PRN

## 2017-10-31 NOTE — Telephone Encounter (Signed)
Left message informing pt Dr. Jacqualyn Posey had written a prescription for her pain medication, and it could be picked up in the [place] office.

## 2017-11-01 ENCOUNTER — Ambulatory Visit: Payer: Medicaid Other

## 2017-11-01 ENCOUNTER — Ambulatory Visit (INDEPENDENT_AMBULATORY_CARE_PROVIDER_SITE_OTHER): Payer: Medicaid Other

## 2017-11-01 DIAGNOSIS — M205X1 Other deformities of toe(s) (acquired), right foot: Secondary | ICD-10-CM | POA: Diagnosis not present

## 2017-11-01 DIAGNOSIS — M79674 Pain in right toe(s): Secondary | ICD-10-CM | POA: Diagnosis not present

## 2017-11-01 DIAGNOSIS — M2041 Other hammer toe(s) (acquired), right foot: Secondary | ICD-10-CM

## 2017-11-08 ENCOUNTER — Ambulatory Visit (INDEPENDENT_AMBULATORY_CARE_PROVIDER_SITE_OTHER): Payer: Medicaid Other | Admitting: Podiatry

## 2017-11-08 ENCOUNTER — Ambulatory Visit (INDEPENDENT_AMBULATORY_CARE_PROVIDER_SITE_OTHER): Payer: Medicaid Other

## 2017-11-08 ENCOUNTER — Encounter: Payer: Medicaid Other | Admitting: Podiatry

## 2017-11-08 ENCOUNTER — Encounter: Payer: Self-pay | Admitting: Podiatry

## 2017-11-08 DIAGNOSIS — M2041 Other hammer toe(s) (acquired), right foot: Secondary | ICD-10-CM

## 2017-11-08 NOTE — Progress Notes (Signed)
Subjective: Hannah Palmer is a 21 y.o. is seen today in office s/p right second digit hammertoe preformed on 10/25/2017.  She also had her come in the office last week as she hit her toe again.  She presents today for further evaluation for scheduled appointment.  She is remaining surgical shoe. She presents today with her boyfriend.  Denies any systemic complaints such as fevers, chills, nausea, vomiting. No calf pain, chest pain, shortness of breath.   Objective: General: No acute distress, AAOx3  DP/PT pulses palpable 2/4, CRT < 3 sec to all digits.  Protective sensation intact. Motor function intact.  RIGHT foot: Incision is well coapted without any evidence of dehiscence and sutures are intact.  K wire intact as well without any drainage or pus.  The toe is in a rectus position. There is no surrounding erythema, ascending cellulitis, fluctuance, crepitus, malodor, drainage/purulence. There is mild edema around the surgical site but overall the appearance is unchanged compared to last appointment. There is no significant pain along the surgical site.  The toe was in rectus position. No other areas of tenderness to bilateral lower extremities.  No other open lesions or pre-ulcerative lesions.  No pain with calf compression, swelling, warmth, erythema.   Assessment and Plan:  Status post right second digit hammertoe repair, presents today because she stubbed her toe  -Treatment options discussed including all alternatives, risks, and complications -Due to having a revision surgery and his children felt that she is headed towards only the sutures intact to ensure healing.  Antibiotic ointment was applied followed by a bandage.  Keep the dressing clean, dry, intact. -Continue surgical shoe -Ice elevation -Pain medication as needed.  She has not been taking this -Monitor for any clinical signs or symptoms of infection and DVT/PE and directed to call the office immediately should any occur or  go to the ER. -Follow-up in 1 week for suture removal or sooner if any problems arise. In the meantime, encouraged to call the office with any questions, concerns, change in symptoms.   Celesta Gentile, DPM

## 2017-11-17 ENCOUNTER — Ambulatory Visit (INDEPENDENT_AMBULATORY_CARE_PROVIDER_SITE_OTHER): Payer: Medicaid Other | Admitting: Podiatry

## 2017-11-17 DIAGNOSIS — M2041 Other hammer toe(s) (acquired), right foot: Secondary | ICD-10-CM

## 2017-11-20 NOTE — Progress Notes (Signed)
Subjective: Hannah Palmer is a 22 y.o. is seen today in office s/p right second digit hammertoe preformed on 10/25/2017.  She is remained in the surgicalshoe.  Overall she is doing well she is having some soreness of the toe but this is improving.  She does walk and stand quite a bit in her shoe.  She presents to the boyfriend without any other concerns today.  She presents here for suture removal. Denies any systemic complaints such as fevers, chills, nausea, vomiting. No calf pain, chest pain, shortness of breath.   Objective: General: No acute distress, AAOx3  DP/PT pulses palpable 2/4, CRT < 3 sec to all digits.  Protective sensation intact. Motor function intact.  RIGHT foot: Incision is well coapted without any evidence of dehiscence and sutures are intact.  K wire intact as well without any drainage or pus.  The toe is in a rectus position. There is no surrounding erythema, ascending cellulitis, fluctuance, crepitus, malodor, drainage/purulence. There is mild edema around the surgical site. There is no significant pain along the surgical site.  The toe was in rectus position.  Overall she is doing well. No other areas of tenderness to bilateral lower extremities.  No other open lesions or pre-ulcerative lesions.  No pain with calf compression, swelling, warmth, erythema.   Assessment and Plan:  Status post right second digit hammertoe repair, presents today for suture removal  -Treatment options discussed including all alternatives, risks, and complications -Sutures removed today without complications.  After removal the incision remained well coapted.  Antibiotic ointment was applied followed by a bandage.  Keep the dressing clean, dry, intact. -Remain in surgical shoe -Pain medication as needed.  She has not been taking this -Monitor for any clinical signs or symptoms of infection and DVT/PE and directed to call the office immediately should any occur or go to the ER. -Follow-up in  2-3 week for possible pin removal or sooner if any problems arise. In the meantime, encouraged to call the office with any questions, concerns, change in symptoms.   *X-ray next appointment  Celesta Gentile, DPM

## 2017-11-21 NOTE — Progress Notes (Signed)
Hannah Palmer is a 22 y.o. is seen today in office s/p right second digit hammertoe preformed on 10/25/2017. They state their pain is minimal.  She presents today because she stopped her toe and she went to have area checked.  She has been wearing the surgical shoe.  She states that when she stubbed her toe she has an increase in pain but it quickly dissipated.    Xrays were obtained and reviewed by Dr Jacqualyn Posey. K-wire in place as before on previous images. Sterile dressing applied and I advised her to keep her follow up appt

## 2017-11-30 ENCOUNTER — Other Ambulatory Visit (INDEPENDENT_AMBULATORY_CARE_PROVIDER_SITE_OTHER): Payer: Self-pay | Admitting: Physician Assistant

## 2017-11-30 DIAGNOSIS — H60392 Other infective otitis externa, left ear: Secondary | ICD-10-CM

## 2017-11-30 DIAGNOSIS — B354 Tinea corporis: Secondary | ICD-10-CM

## 2017-11-30 DIAGNOSIS — L299 Pruritus, unspecified: Secondary | ICD-10-CM

## 2017-11-30 NOTE — Telephone Encounter (Signed)
FWD to PCP. Ota Ebersole S Aylee Littrell, CMA  

## 2017-12-08 ENCOUNTER — Ambulatory Visit (INDEPENDENT_AMBULATORY_CARE_PROVIDER_SITE_OTHER): Payer: Medicaid Other

## 2017-12-08 ENCOUNTER — Ambulatory Visit (INDEPENDENT_AMBULATORY_CARE_PROVIDER_SITE_OTHER): Payer: Medicaid Other | Admitting: Podiatry

## 2017-12-08 ENCOUNTER — Encounter: Payer: Self-pay | Admitting: Podiatry

## 2017-12-08 DIAGNOSIS — M2041 Other hammer toe(s) (acquired), right foot: Secondary | ICD-10-CM

## 2017-12-08 NOTE — Patient Instructions (Signed)
You can start to shower tomorrow but do not soak your foot. After you shower dry well and use a small amount of antibiotic ointment over the pin site and incision. If the pin site is not closed wait until it has done so before showering.   Continue in surgical shoe for now. As you feel able start to GRADUALLY wear your regular shoe for short distances around the house and then gradually increase activity.   Continue with ice to the area daily  Monitor for any signs/symptoms of infection. Call the office immediately if any occur or go directly to the emergency room. Call with any questions/concerns.

## 2017-12-10 NOTE — Progress Notes (Signed)
Subjective: Hannah Palmer is a 22 y.o. is seen today in office s/p right second digit hammertoe preformed on 10/25/2017.  She presents today for pin removal.  She is remaining surgical shoe.  She is not taking any pain medication she is doing well.  She has no new concerns today.  No recent injury or trauma. Denies any systemic complaints such as fevers, chills, nausea, vomiting. No calf pain, chest pain, shortness of breath.   Objective: General: No acute distress, AAOx3  DP/PT pulses palpable 2/4, CRT < 3 sec to all digits.  Protective sensation intact. Motor function intact.  RIGHT foot: Incision is well coapted without any evidence of dehiscence and eschar is formed.  K wire intact as well without any drainage or pus.  The toe is in a rectus position. There is no surrounding erythema, ascending cellulitis, fluctuance, crepitus, malodor, drainage/purulence. There is decreased edema around the surgical site. There is no pain along the surgical site.  The toe was in rectus position.  Overall she is doing well. No other areas of tenderness to bilateral lower extremities.  No other open lesions or pre-ulcerative lesions.  No pain with calf compression, swelling, warmth, erythema.   Assessment and Plan:  Status post right second digit hammertoe repair, presents today for suture removal  -Treatment options discussed including all alternatives, risks, and complications -X-rays were obtained and reviewed.  Hardware intact.  No evidence of acute fracture identified. -The pin site was prepped with alcohol.  The pin was then removed in toto without any complications.  Area was cleaned and antibiotic ointment was applied followed by a bandage.  She can start to shower tomorrow as long as the pin site is closed as well as the incision doing well.  Continue antibiotic ointment dressing changes daily.  She can slowly start to transition to regular shoe as tolerated over the next week.  Gradual increase  activity.  Continue ice and elevation. -Follow-up in 3 weeks for final check or sooner if needed.  Call any questions or concerns.  Celesta Gentile, DPM

## 2017-12-15 NOTE — Progress Notes (Signed)
DOS 12.19.18 Rt 2nd toe hammertoe repair w wire fixation( DIPJ Arthroplasty)

## 2017-12-20 ENCOUNTER — Encounter (INDEPENDENT_AMBULATORY_CARE_PROVIDER_SITE_OTHER): Payer: Self-pay | Admitting: Physician Assistant

## 2017-12-20 ENCOUNTER — Ambulatory Visit (INDEPENDENT_AMBULATORY_CARE_PROVIDER_SITE_OTHER): Payer: Medicaid Other | Admitting: Physician Assistant

## 2017-12-20 VITALS — BP 86/50 | HR 77 | Resp 18 | Ht 68.0 in | Wt 217.0 lb

## 2017-12-20 DIAGNOSIS — L603 Nail dystrophy: Secondary | ICD-10-CM

## 2017-12-20 DIAGNOSIS — L210 Seborrhea capitis: Secondary | ICD-10-CM | POA: Diagnosis not present

## 2017-12-20 DIAGNOSIS — K581 Irritable bowel syndrome with constipation: Secondary | ICD-10-CM

## 2017-12-20 DIAGNOSIS — L299 Pruritus, unspecified: Secondary | ICD-10-CM | POA: Diagnosis not present

## 2017-12-20 MED ORDER — TRIAMCINOLONE ACETONIDE 0.1 % EX CREA: TOPICAL_CREAM | CUTANEOUS | 0 refills | Status: DC

## 2017-12-20 MED ORDER — SELENIUM SULFIDE 2.25 % EX SHAM: 1.0000 "application " | MEDICATED_SHAMPOO | Freq: Every day | CUTANEOUS | 0 refills | Status: AC

## 2017-12-20 MED ORDER — LUBIPROSTONE 8 MCG PO CAPS: 8.0000 ug | ORAL_CAPSULE | Freq: Two times a day (BID) | ORAL | 1 refills | Status: DC

## 2017-12-20 MED ORDER — POLYETHYLENE GLYCOL 3350 17 GM/SCOOP PO POWD: 17.0000 g | Freq: Every day | ORAL | 1 refills | Status: DC

## 2017-12-20 NOTE — Patient Instructions (Signed)
Diet for Irritable Bowel Syndrome When you have irritable bowel syndrome (IBS), the foods you eat and your eating habits are very important. IBS may cause various symptoms, such as abdominal pain, constipation, or diarrhea. Choosing the right foods can help ease discomfort caused by these symptoms. Work with your health care provider and dietitian to find the best eating plan to help control your symptoms. What general guidelines do I need to follow?  Keep a food diary. This will help you identify foods that cause symptoms. Write down: ? What you eat and when. ? What symptoms you have. ? When symptoms occur in relation to your meals.  Avoid foods that cause symptoms. Talk with your dietitian about other ways to get the same nutrients that are in these foods.  Eat more foods that contain fiber. Take a fiber supplement if directed by your dietitian.  Eat your meals slowly, in a relaxed setting.  Aim to eat 5-6 small meals per day. Do not skip meals.  Drink enough fluids to keep your urine clear or pale yellow.  Ask your health care provider if you should take an over-the-counter probiotic during flare-ups to help restore healthy gut bacteria.  If you have cramping or diarrhea, try making your meals low in fat and high in carbohydrates. Examples of carbohydrates are pasta, rice, whole grain breads and cereals, fruits, and vegetables.  If dairy products cause your symptoms to flare up, try eating less of them. You might be able to handle yogurt better than other dairy products because it contains bacteria that help with digestion. What foods are not recommended? The following are some foods and drinks that may worsen your symptoms:  Fatty foods, such as French fries.  Milk products, such as cheese or ice cream.  Chocolate.  Alcohol.  Products with caffeine, such as coffee.  Carbonated drinks, such as soda.  The items listed above may not be a complete list of foods and beverages to  avoid. Contact your dietitian for more information. What foods are good sources of fiber? Your health care provider or dietitian may recommend that you eat more foods that contain fiber. Fiber can help reduce constipation and other IBS symptoms. Add foods with fiber to your diet a little at a time so that your body can get used to them. Too much fiber at once might cause gas and swelling of your abdomen. The following are some foods that are good sources of fiber:  Apples.  Peaches.  Pears.  Berries.  Figs.  Broccoli (raw).  Cabbage.  Carrots.  Raw peas.  Kidney beans.  Lima beans.  Whole grain bread.  Whole grain cereal.  Where to find more information: International Foundation for Functional Gastrointestinal Disorders: www.iffgd.org National Institute of Diabetes and Digestive and Kidney Diseases: www.niddk.nih.gov/health-information/health-topics/digestive-diseases/ibs/Pages/facts.aspx This information is not intended to replace advice given to you by your health care provider. Make sure you discuss any questions you have with your health care provider. Document Released: 01/21/2004 Document Revised: 04/07/2016 Document Reviewed: 01/31/2014 Elsevier Interactive Patient Education  2018 Elsevier Inc.  

## 2017-12-20 NOTE — Progress Notes (Signed)
Subjective:  Patient ID: Hannah Palmer, female    DOB: 01/14/1996  Age: 22 y.o. MRN: [id number]  CC: f/u   HPI  AARILYN Palmer is a 22 y.o. female with a medical history of ADHD, depression, and migraines presents to f/u on scalp itch. Itch and dandruff have resolved with Selenium sulfide and triamcinolone.     Complains of one month history of loose stools, occasional abdominal pain, has BM 1-2 times per week, fecal urgency, and tenesmus. Does not know if stools have mucus. Mother has IBS and depression.     Complains of dystrophic toe nail. Had a procedure approximately two months ago which involved removing the toe nail but the toe nail did not grow back smooth. No other complaints or symptoms.     Outpatient Medications Prior to Visit  Medication Sig Dispense Refill  . acetaminophen-codeine (TYLENOL #3) 300-30 MG tablet Take 1 tablet by mouth every 4 (four) hours as needed for moderate pain. 15 tablet 0  . acetaminophen (TYLENOL) 325 MG tablet Take 650 mg by mouth every 6 (six) hours as needed.    Marland Kitchen amphetamine-dextroamphetamine (ADDERALL XR) 10 MG 24 hr capsule Take 10 mg by mouth daily. Take between noon and 2 pm daily    . Aspirin-Acetaminophen-Caffeine (EXCEDRIN EXTRA STRENGTH PO) Take by mouth. As needed    . cephALEXin (KEFLEX) 500 MG capsule Take 1 capsule (500 mg total) by mouth 3 (three) times daily. 28 capsule 0  . citalopram (CELEXA) 20 MG tablet Take 20 mg by mouth.    . clotrimazole (LOTRIMIN) 1 % cream APPLY 1 APPLICATION TOPICALLY TWO TIMES A DAY 60 g 0  . diclofenac (CATAFLAM) 50 MG tablet Take 1 tablet (50 mg total) by mouth 3 (three) times daily. 90 tablet 0  . fluticasone (FLONASE) 50 MCG/ACT nasal spray Place 1 spray into the nose.    Marland Kitchen GUANFACINE HCL PO Take 1 tablet by mouth daily.    Marland Kitchen HYDROcodone-acetaminophen (NORCO/VICODIN) 5-325 MG tablet Take 1 tablet by mouth every 4 (four) hours as needed. 25 tablet 0  . NEOMYCIN-POLYMYXIN-HYDROCORTISONE  (CORTISPORIN) 1 % SOLN OTIC solution PLACE 3 DROPS INTO THE LEFT EAR FOUR TIMES A DAY 1 Bottle 0  . promethazine (PHENERGAN) 25 MG tablet Take 1 tablet (25 mg total) by mouth every 8 (eight) hours as needed for nausea or vomiting. 20 tablet 0  . ranitidine (ZANTAC 75) 75 MG tablet Take 1 tablet (75 mg total) by mouth 2 (two) times daily. 30 tablet 0  . Selenium Sulfide 2.25 % SHAM Apply 1 application topically daily. 180 mL 0  . sodium chloride (OCEAN) 0.65 % SOLN nasal spray Place 2 sprays into both nostrils as needed for congestion. 1 Bottle 1  . topiramate (TOPAMAX) 50 MG tablet Take 1 tablet (50 mg total) 2 (two) times daily by mouth. 180 tablet 4  . triamcinolone cream (KENALOG) 0.1 % APPLY TWO TIMES A DAY EXTERNALLY TO AFFECTED AREAS. DO NOT USE FOR MORE THAN 7 CONSECUTIVE DAYS 28.4 g 0  . amphetamine-dextroamphetamine (ADDERALL XR) 30 MG 24 hr capsule Take 1 capsule (30 mg total) by mouth daily with breakfast. 30 capsule 0   Facility-Administered Medications Prior to Visit  Medication Dose Route Frequency Provider Last Rate Last Dose  . triamcinolone cream (KENALOG) 0.1 %   Topical BID Clent Demark, PA-C         ROS Review of Systems  Constitutional: Negative for chills, fever and malaise/fatigue.  Eyes: Negative for  blurred vision.  Respiratory: Negative for shortness of breath.   Cardiovascular: Negative for chest pain and palpitations.  Gastrointestinal: Positive for abdominal pain and constipation. Negative for nausea.  Genitourinary: Negative for dysuria and hematuria.  Musculoskeletal: Negative for joint pain and myalgias.  Skin: Negative for rash.       Dystrophic nail  Neurological: Negative for tingling and headaches.  Psychiatric/Behavioral: Negative for depression. The patient is not nervous/anxious.     Objective:  Ht 5\' 8"  (1.727 m)   Wt 217 lb (98.4 kg)   LMP 12/05/2017   BMI 32.99 kg/m   BP/Weight 12/20/2017 10/18/2017 38/11/8297  Systolic BP - 371 696   Diastolic BP - 70 71  Wt. (Lbs) 217 222.2 227.8  BMI 32.99 34.29 35.15  Some encounter information is confidential and restricted. Go to Review Flowsheets activity to see all data.      Physical Exam  Constitutional: She is oriented to person, place, and time.  Well developed, overweight, NAD, polite  HENT:  Head: Normocephalic and atraumatic.  Eyes: Conjunctivae are normal. No scleral icterus.  Neck: Normal range of motion. Neck supple. No thyromegaly present.  Cardiovascular: Normal rate, regular rhythm and normal heart sounds.  Pulmonary/Chest: Effort normal and breath sounds normal.  Musculoskeletal: She exhibits no edema.  Neurological: She is alert and oriented to person, place, and time. No cranial nerve deficit. Coordination normal.  Skin: Skin is warm and dry. No rash noted. No erythema. No pallor.  Scalp without erythema or flaking. Right 2nd digit toe nail dystrophic and with mild yellowish discoloration  Psychiatric: She has a normal mood and affect. Her behavior is normal. Thought content normal.  Vitals reviewed.    Assessment & Plan:   1. Scalp itch - Resolved. Refill triamcinolone cream (KENALOG) 0.1 %; APPLY TWO TIMES A DAY EXTERNALLY TO AFFECTED AREAS. DO NOT USE FOR MORE THAN 7 CONSECUTIVE DAYS  Dispense: 28.4 g; Refill: 0  2. Dandruff - Resolved. Refill Selenium Sulfide 2.25 % SHAM; Apply 1 application topically daily.  Dispense: 180 mL; Refill: 0  3. Irritable bowel syndrome with constipation - Begin polyethylene glycol powder (GLYCOLAX/MIRALAX) powder; Take 17 g by mouth daily.  Dispense: 3350 g; Refill: 1 - Begin lubiprostone (AMITIZA) 8 MCG capsule; Take 1 capsule (8 mcg total) by mouth 2 (two) times daily with a meal.  Dispense: 60 capsule; Refill: 1  4. Dystrophic nail - I have advised for patient to grow nail so a clipping can be sent to the lab for fungal analysis.  Meds ordered this encounter  Medications  . Selenium Sulfide 2.25 % SHAM     Sig: Apply 1 application topically daily.    Dispense:  180 mL    Refill:  0    Order Specific Question:   Supervising Provider    Answer:   Tresa Garter [id number]  . triamcinolone cream (KENALOG) 0.1 %    Sig: APPLY TWO TIMES A DAY EXTERNALLY TO AFFECTED AREAS. DO NOT USE FOR MORE THAN 7 CONSECUTIVE DAYS    Dispense:  28.4 g    Refill:  0    Order Specific Question:   Supervising Provider    Answer:   Tresa Garter [id number]  . polyethylene glycol powder (GLYCOLAX/MIRALAX) powder    Sig: Take 17 g by mouth daily.    Dispense:  3350 g    Refill:  1    Order Specific Question:   Supervising Provider    Answer:   Angelica Chessman  E [[id number]]  . lubiprostone (AMITIZA) 8 MCG capsule    Sig: Take 1 capsule (8 mcg total) by mouth 2 (two) times daily with a meal.    Dispense:  60 capsule    Refill:  1    Order Specific Question:   Supervising Provider    Answer:   Tresa Garter [id number]    Follow-up: Return in about 8 weeks (around 02/14/2018) for ibs-c.   Clent Demark PA

## 2017-12-29 ENCOUNTER — Encounter: Payer: Medicaid Other | Admitting: Podiatry

## 2018-01-01 ENCOUNTER — Encounter: Payer: Medicaid Other | Admitting: Podiatry

## 2018-01-09 ENCOUNTER — Encounter: Payer: Medicaid Other | Admitting: Podiatry

## 2018-01-15 ENCOUNTER — Ambulatory Visit (INDEPENDENT_AMBULATORY_CARE_PROVIDER_SITE_OTHER): Payer: Medicaid Other | Admitting: Podiatry

## 2018-01-15 ENCOUNTER — Encounter: Payer: Self-pay | Admitting: Podiatry

## 2018-01-15 DIAGNOSIS — M205X1 Other deformities of toe(s) (acquired), right foot: Secondary | ICD-10-CM

## 2018-01-15 NOTE — Progress Notes (Signed)
Subjective: Hannah Palmer is a 22 y.o. is seen today in office s/p right second digit hammertoe preformed on 10/25/2017.She states that she only has minimal pain and is able to wear a regular shoe at home and at work. She does not ice it. She does state that since this 2nd surgery she has stubbed it during the postop process but no recent injury.Still mildly swollen. Denies any systemic complaints such as fevers, chills, nausea, vomiting. No calf pain, chest pain, shortness of breath.   Objective: General: No acute distress, AAOx3  DP/PT pulses palpable 2/4, CRT < 3 sec to all digits.  Protective sensation intact. Motor function intact.  RIGHT foot: Incision is well coapted without any evidence of dehiscence and a scar has formed.  The toes and rectus position.  There is no erythema or increase in warmth.  No tenderness palpation of the toe.  No other areas of tenderness bilaterally.  No other open lesions or pre-ulcerative lesions.  No pain with calf compression, swelling, warmth, erythema.   Assessment and Plan:  Status post right second digit hammertoe repair  -Treatment options discussed including all alternatives, risks, and complications -Declined x-rays today because her insurance has changed and they apparently will not be covered today.  -I did give her offloading pads to protect the toe.  Continue ice and supportive shoes.  She is wearing an old shoe and I want her to wear more supportive shoe. -When she is doing well.  We will see her back as an as-needed basis but there is any issues to call the office and she agrees this plan has no further questions or concerns today.  Celesta Gentile, DPM

## 2018-01-19 ENCOUNTER — Ambulatory Visit (INDEPENDENT_AMBULATORY_CARE_PROVIDER_SITE_OTHER): Payer: Medicaid Other | Admitting: Physician Assistant

## 2018-02-14 ENCOUNTER — Ambulatory Visit (INDEPENDENT_AMBULATORY_CARE_PROVIDER_SITE_OTHER): Payer: Medicaid Other | Admitting: Physician Assistant

## 2018-02-28 ENCOUNTER — Telehealth: Payer: Self-pay | Admitting: *Deleted

## 2018-02-28 ENCOUNTER — Other Ambulatory Visit: Payer: Self-pay | Admitting: *Deleted

## 2018-02-28 DIAGNOSIS — K581 Irritable bowel syndrome with constipation: Secondary | ICD-10-CM

## 2018-02-28 MED ORDER — LUBIPROSTONE 8 MCG PO CAPS: 8.0000 ug | ORAL_CAPSULE | Freq: Two times a day (BID) | ORAL | 1 refills | Status: DC

## 2018-02-28 NOTE — Telephone Encounter (Signed)
Left message on voicemail to call back: Attempt to call patient to inform her that  she may be eligible to receive lubiprostone (AMITIZA) 8 MCG capsule  at the [facility] pharmacy through [place].  Would need to come to office to pick up medication and complete paperwork if applicable.

## 2018-02-28 NOTE — Telephone Encounter (Signed)
Patient does not qualify for for pass as she has [employer]. With her insurance the [facility] Pierini is going to cost $258 that is with a coupon. [facility] pharmacy staff ran linzess to see if it would be cheaper with patients insurance and it is more, costing $409.42. Please consider another option if one is available. Nat Christen, CMA

## 2018-03-05 NOTE — Telephone Encounter (Signed)
Only other alternatives is to use bulk forming agent. I have sent to her pharmacy. Alternatively, she may likely have to see GI if bulk forming agent is ineffective.

## 2018-03-05 NOTE — Telephone Encounter (Signed)
Medication does not show in chart that its been sent, patient also asking for Rx for fluticasone. Nat Christen, CMA

## 2018-03-05 NOTE — Telephone Encounter (Signed)
Left message asking patient to return call to office. Nat Christen, CMA

## 2018-03-06 ENCOUNTER — Other Ambulatory Visit (INDEPENDENT_AMBULATORY_CARE_PROVIDER_SITE_OTHER): Payer: Self-pay | Admitting: Physician Assistant

## 2018-03-06 DIAGNOSIS — K580 Irritable bowel syndrome with diarrhea: Secondary | ICD-10-CM

## 2018-03-06 MED ORDER — PSYLLIUM HUSK POWD: 5.0000 g | Freq: Two times a day (BID) | 2 refills | Status: DC

## 2018-03-06 NOTE — Telephone Encounter (Signed)
Sorry, my mistake. I have sent it now to [facility] as I believe they may have the Psyllium husk powder. [facility] is less likely to have it.

## 2018-03-08 NOTE — Telephone Encounter (Signed)
Patient is also asking for Rx for Fluticasone. Nat Christen, CMA

## 2018-03-09 NOTE — Telephone Encounter (Signed)
Fluticasone not appropriate since I have not evaluated her symptoms to justify her request. Fluticasone is known as Flonase OTC if she feel that she needs something for allergies.

## 2018-03-09 NOTE — Telephone Encounter (Signed)
Patient aware. Hannah Palmer, CMA  

## 2018-03-12 ENCOUNTER — Other Ambulatory Visit (INDEPENDENT_AMBULATORY_CARE_PROVIDER_SITE_OTHER): Payer: Self-pay | Admitting: Physician Assistant

## 2018-03-12 MED ORDER — DICYCLOMINE HCL 10 MG PO CAPS: 10.0000 mg | ORAL_CAPSULE | Freq: Three times a day (TID) | ORAL | 1 refills | Status: DC

## 2018-03-13 ENCOUNTER — Telehealth (INDEPENDENT_AMBULATORY_CARE_PROVIDER_SITE_OTHER): Payer: Self-pay

## 2018-03-13 NOTE — Telephone Encounter (Signed)
-----   Message from Clent Demark, PA-C sent at 03/12/2018  4:15 PM EDT ----- Please notify patient Selenium sulfide shampoo was not covered by her insurance. Please tell her to pick up Selson blue OTC shampoo or pay $84.13 for the Selenium sulfide shampoo. I have also had to change Amitiza because this is not covered. I sent Bentyl and she may pick up at her [address].

## 2018-03-13 NOTE — Telephone Encounter (Signed)
Patient is aware. Hannah Palmer, CMA  

## 2019-02-08 ENCOUNTER — Other Ambulatory Visit: Payer: Self-pay

## 2019-02-08 ENCOUNTER — Ambulatory Visit (HOSPITAL_COMMUNITY)
Admission: EM | Admit: 2019-02-08 | Discharge: 2019-02-08 | Disposition: A | Discharge status: Home / Self Care | Payer: BLUE CROSS/BLUE SHIELD | Attending: Family Medicine | Admitting: Family Medicine

## 2019-02-08 DIAGNOSIS — Z113 Encounter for screening for infections with a predominantly sexual mode of transmission: Secondary | ICD-10-CM

## 2019-02-08 DIAGNOSIS — N760 Acute vaginitis: Secondary | ICD-10-CM | POA: Insufficient documentation

## 2019-02-08 MED ORDER — FLUCONAZOLE 150 MG PO TABS: 150.0000 mg | ORAL_TABLET | Freq: Every day | ORAL | 0 refills | Status: DC
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
## 2019-02-08 NOTE — Discharge Instructions (Addendum)
We will go ahead and treat you for yeast infection Diflucan 1 tab today and 1 tab in 3 days if still having symptoms Your swab has been sent for testing we will call you with any positive results

## 2019-02-11 NOTE — ED Provider Notes (Signed)
[place]    CSN: [id number] Arrival date & time: 02/08/19  1331     History   Chief Complaint Chief Complaint  Patient presents with  . Vaginal Discharge  . Vaginal Itching    HPI Hannah Palmer is a 23 y.o. female.   Patient is a 23 year old female who presents today with vaginal discharge and itching with irritation.  Slight odor to the discharge.  She reports that the discharge is thicker and a yellowish-white color.  She is concerned because she had unprotected sex 3 weeks ago.  She would like to be checked for STDs.Patient's last menstrual period was 01/28/2019.  She denies any associated abdominal pain, back pain, pelvic pain, dysuria, hematuria, urinary frequency, fevers, chills.  ROS per HPI     Vaginal Discharge  Associated symptoms: vaginal itching   Vaginal Itching     Past Medical History:  Diagnosis Date  . ADHD (attention deficit hyperactivity disorder)   . Allergy   . Depression   . Migraine     Patient Active Problem List   Diagnosis Date Noted  . Scalp itch 04/18/2017  . Encounter for vision screening 07/07/2016  . 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 Frequency of urination 12/03/2015  . Screening 12/03/2015  . Migraine without aura and without status migrainosus, not intractable 12/03/2015  . Hammer toe 11/03/2015  . Epigastric pain 08/25/2014  . ADHD (attention deficit hyperactivity disorder) 07/03/2013    Past Surgical History:  Procedure Laterality Date  . ADENOIDECTOMY  2001  . Base Wedge Osteotomy Right 02/27/2014   @ [facility]  . OSTEOTOMY Right 02/27/2014   Met Head Rt #5 @ [facility]  . TONSILLECTOMY  Age 23  . TYMPANOSTOMY TUBE PLACEMENT      OB History   No obstetric history on file.      Home Medications    Prior to Admission medications   Medication Sig Start Date End Date Taking? Authorizing Provider  acetaminophen (TYLENOL) 325 MG tablet Take 650 mg by mouth every 6 (six) hours as needed.    [provider]  amphetamine-dextroamphetamine (ADDERALL XR) 10 MG 24 hr capsule Take 10 mg by mouth 2 (two) times daily. Take between noon and 2 pm daily     [provider]  Aspirin-Acetaminophen-Caffeine (EXCEDRIN EXTRA STRENGTH PO) Take by mouth. As needed    [provider]  clotrimazole (LOTRIMIN) 1 % cream APPLY 1 APPLICATION TOPICALLY TWO TIMES A DAY 11/30/17   Clent Demark, PA-C  dicyclomine (BENTYL) 10 MG capsule Take 1 capsule (10 mg total) by mouth 3 (three) times daily before meals. 03/12/18   Clent Demark, PA-C  fluconazole (DIFLUCAN) 150 MG tablet Take 1 tablet (150 mg total) by mouth daily. 02/08/19   Latanja Lehenbauer, Tressia Miners A, NP  fluticasone (FLONASE) 50 MCG/ACT nasal spray Place 1 spray into the nose. 06/11/14   [provider]  GUANFACINE HCL PO Take 1 tablet by mouth daily.    [provider]  lubiprostone (AMITIZA) 8 MCG capsule Take 1 capsule (8 mcg total) by mouth 2 (two) times daily with a meal. 02/28/18   Clent Demark, PA-C  NEOMYCIN-POLYMYXIN-HYDROCORTISONE (CORTISPORIN) 1 % SOLN OTIC solution PLACE 3 DROPS INTO THE LEFT EAR FOUR TIMES A DAY 11/30/17   Clent Demark, PA-C  polyethylene glycol powder [facility]) powder Take 17 g by mouth daily. 12/20/17   Clent Demark, PA-C  promethazine (PHENERGAN) 25 MG tablet Take 1 tablet (25 mg total) by mouth  every 8 (eight) hours as needed for nausea or vomiting. 10/25/17   Trula Slade, DPM  Psyllium Husk POWD Take 5 g by mouth 2 (two) times daily. 03/06/18   Clent Demark, PA-C  ranitidine (ZANTAC 75) 75 MG tablet Take 1 tablet (75 mg total) by mouth 2 (two) times daily. 03/20/17   Clent Demark, PA-C  Selenium Sulfide 2.25 % SHAM Apply 1 application topically daily. 12/20/17   Clent Demark, PA-C  sodium chloride (OCEAN) 0.65 % SOLN nasal spray Place 2 sprays into both nostrils as needed for congestion. 12/22/15 01/19/16  Leveda Anna, NP  topiramate (TOPAMAX) 50 MG  tablet Take 1 tablet (50 mg total) 2 (two) times daily by mouth. 09/20/17   Penumalli, Earlean Polka, MD  triamcinolone cream (KENALOG) 0.1 % APPLY TWO TIMES A DAY EXTERNALLY TO AFFECTED AREAS. DO NOT USE FOR MORE THAN 7 CONSECUTIVE DAYS 12/20/17   Clent Demark, PA-C    Family History Family History  Problem Relation Age of Onset  . Bipolar disorder Mother   . Stroke Mother   . ADD / ADHD Father   . Depression Maternal Grandmother   . Hypertension Maternal Grandmother   . Hyperlipidemia Maternal Grandmother   . Heart disease Maternal Grandmother   . Diabetes Maternal Grandmother   . Arthritis Maternal Grandmother   . Skin cancer Maternal Grandmother   . Diabetes Maternal Grandfather   . Stroke Maternal Grandfather   . Colon cancer Maternal Grandfather   . Diabetes Paternal Grandmother   . Diabetes Paternal Grandfather   . Bipolar disorder Maternal Aunt   . Stroke Maternal Aunt     Social History Social History   Tobacco Use  . Smoking status: Passive Smoke Exposure - Never Smoker  . Smokeless tobacco: Never Used  Substance Use Topics  . Alcohol use: No  . Drug use: No     Allergies   Patient has no known allergies.   Review of Systems Review of Systems  Genitourinary: Positive for vaginal discharge.     Physical Exam Triage Vital Signs ED Triage Vitals  Enc Vitals Group     BP 02/08/19 1412 106/74     Pulse Rate 02/08/19 1412 97     Resp 02/08/19 1412 16     Temp 02/08/19 1412 98 F (36.7 C)     Temp Source 02/08/19 1412 Oral     SpO2 02/08/19 1412 97 %     Weight --      Height --      Head Circumference --      Peak Flow --      Pain Score 02/08/19 1411 0     Pain Loc --      Pain Edu? --      Excl. in [place]? --    No data found.  Updated Vital Signs BP 106/74 (BP Location: Right Arm)   Pulse 97   Temp 98 F (36.7 C) (Oral)   Resp 16   LMP 01/28/2019   SpO2 97%   Visual Acuity Right Eye Distance:   Left Eye Distance:   Bilateral Distance:     Right Eye Near:   Left Eye Near:    Bilateral Near:     Physical Exam Vitals signs and nursing note reviewed.  Constitutional:      General: She is not in acute distress.    Appearance: Normal appearance. She is not ill-appearing, toxic-appearing or diaphoretic.  HENT:  Head: Normocephalic.     Nose: Nose normal.     Mouth/Throat:     Pharynx: Oropharynx is clear.  Eyes:     Conjunctiva/sclera: Conjunctivae normal.  Neck:     Musculoskeletal: Normal range of motion.  Pulmonary:     Effort: Pulmonary effort is normal.  Abdominal:     Palpations: Abdomen is soft.     Tenderness: There is no abdominal tenderness.  Musculoskeletal: Normal range of motion.  Skin:    General: Skin is warm and dry.     Findings: No rash.  Neurological:     Mental Status: She is alert.  Psychiatric:        Mood and Affect: Mood normal.      UC Treatments / Results  Labs (all labs ordered are listed, but only abnormal results are displayed) Labs Reviewed  CERVICOVAGINAL ANCILLARY ONLY    EKG None  Radiology No results found.  Procedures Procedures (including critical care time)  Medications Ordered in UC Medications - No data to display  Initial Impression / Assessment and Plan / UC Course  I have reviewed the triage vital signs and the nursing notes.  Pertinent labs & imaging results that were available during my care of the patient were reviewed by me and considered in my medical decision making (see chart for details).     Vaginitis Treating patient with fluconazole for possibility of yeast. Sending self swab for further testing for STDs Labs pending Final Clinical Impressions(s) / UC Diagnoses   Final diagnoses:  Acute vaginitis     Discharge Instructions     We will go ahead and treat you for yeast infection Diflucan 1 tab today and 1 tab in 3 days if still having symptoms Your swab has been sent for testing we will call you with any positive results     ED Prescriptions    Medication Sig Dispense Auth. Provider   fluconazole (DIFLUCAN) 150 MG tablet Take 1 tablet (150 mg total) by mouth daily. 2 tablet Loura Halt A, NP     Controlled Substance Prescriptions [place] Controlled Substance Registry consulted? Not Applicable   Orvan July, NP 02/11/19 1041

## 2019-02-12 LAB — CERVICOVAGINAL ANCILLARY ONLY
BACTERIAL VAGINITIS: NEGATIVE
Candida vaginitis: POSITIVE — AB
Chlamydia: NEGATIVE
Neisseria Gonorrhea: NEGATIVE
Trichomonas: NEGATIVE

## 2021-10-27 ENCOUNTER — Other Ambulatory Visit: Payer: Self-pay

## 2021-10-27 ENCOUNTER — Other Ambulatory Visit: Payer: Self-pay | Admitting: Occupational Medicine

## 2021-10-27 ENCOUNTER — Ambulatory Visit: Payer: Self-pay

## 2021-10-27 DIAGNOSIS — M25521 Pain in right elbow: Secondary | ICD-10-CM

## 2022-08-02 ENCOUNTER — Emergency Department (HOSPITAL_COMMUNITY)

## 2022-08-02 ENCOUNTER — Emergency Department (HOSPITAL_COMMUNITY)
Admission: EM | Admit: 2022-08-02 | Discharge: 2022-08-02 | Disposition: A | Discharge status: Home / Self Care | Attending: Emergency Medicine | Admitting: Emergency Medicine

## 2022-08-02 ENCOUNTER — Encounter (HOSPITAL_COMMUNITY): Payer: Self-pay | Admitting: Emergency Medicine

## 2022-08-02 DIAGNOSIS — W208XXA Other cause of strike by thrown, projected or falling object, initial encounter: Secondary | ICD-10-CM | POA: Insufficient documentation

## 2022-08-02 DIAGNOSIS — Y990 Civilian activity done for income or pay: Secondary | ICD-10-CM | POA: Diagnosis not present

## 2022-08-02 DIAGNOSIS — S99911A Unspecified injury of right ankle, initial encounter: Secondary | ICD-10-CM

## 2022-08-02 DIAGNOSIS — S99921A Unspecified injury of right foot, initial encounter: Secondary | ICD-10-CM | POA: Diagnosis present

## 2022-08-02 DIAGNOSIS — S9031XA Contusion of right foot, initial encounter: Secondary | ICD-10-CM | POA: Insufficient documentation

## 2022-08-02 MED ORDER — IBUPROFEN 800 MG PO TABS: 800.0000 mg | ORAL_TABLET | Freq: Once | ORAL | Status: DC

## 2022-08-02 MED ORDER — MELOXICAM 7.5 MG PO TABS: 7.5000 mg | ORAL_TABLET | Freq: Every day | ORAL | 0 refills | Status: DC

## 2022-08-02 NOTE — ED Provider Notes (Signed)
[place] DEPT Provider Note   CSN: [id number] Arrival date & time: 08/02/22  0020     History  Chief Complaint  Patient presents with   Foot Injury    Hannah Palmer is a 26 y.o. female.  Patient with no pertinent past medical history presents today with complaints of right foot injury.  She states that earlier today when she was at work a weighted bar rolled up onto her right foot.  She endorses pain to same.  States she had to roll it back off of her foot which caused increasing pain.  She has been able to walk since then some pain.  Denies any other injuries or complaints.  The history is provided by the patient. No language interpreter was used.  Foot Injury      Home Medications Prior to Admission medications   Medication Sig Start Date End Date Taking? Authorizing Provider  acetaminophen (TYLENOL) 325 MG tablet Take 650 mg by mouth every 6 (six) hours as needed.    [provider]  amphetamine-dextroamphetamine (ADDERALL XR) 10 MG 24 hr capsule Take 10 mg by mouth 2 (two) times daily. Take between noon and 2 pm daily     [provider]  Aspirin-Acetaminophen-Caffeine (EXCEDRIN EXTRA STRENGTH PO) Take by mouth. As needed    [provider]  clotrimazole (LOTRIMIN) 1 % cream APPLY 1 APPLICATION TOPICALLY TWO TIMES A DAY 11/30/17   Clent Demark, PA-C  dicyclomine (BENTYL) 10 MG capsule Take 1 capsule (10 mg total) by mouth 3 (three) times daily before meals. 03/12/18   Clent Demark, PA-C  fluconazole (DIFLUCAN) 150 MG tablet Take 1 tablet (150 mg total) by mouth daily. 02/08/19   Bast, Tressia Miners A, NP  fluticasone (FLONASE) 50 MCG/ACT nasal spray Place 1 spray into the nose. 06/11/14   [provider]  GUANFACINE HCL PO Take 1 tablet by mouth daily.    [provider]  lubiprostone (AMITIZA) 8 MCG capsule Take 1 capsule (8 mcg total) by mouth 2 (two) times daily with a meal. 02/28/18   Clent Demark, PA-C  NEOMYCIN-POLYMYXIN-HYDROCORTISONE (CORTISPORIN) 1 % SOLN OTIC solution PLACE 3 DROPS INTO THE LEFT EAR FOUR TIMES A DAY 11/30/17   Clent Demark, PA-C  polyethylene glycol powder [facility]) powder Take 17 g by mouth daily. 12/20/17   Clent Demark, PA-C  promethazine (PHENERGAN) 25 MG tablet Take 1 tablet (25 mg total) by mouth every 8 (eight) hours as needed for nausea or vomiting. 10/25/17   Trula Slade, DPM  Psyllium Husk POWD Take 5 g by mouth 2 (two) times daily. 03/06/18   Clent Demark, PA-C  ranitidine (ZANTAC 75) 75 MG tablet Take 1 tablet (75 mg total) by mouth 2 (two) times daily. 03/20/17   Clent Demark, PA-C  Selenium Sulfide 2.25 % SHAM Apply 1 application topically daily. 12/20/17   Clent Demark, PA-C  sodium chloride (OCEAN) 0.65 % SOLN nasal spray Place 2 sprays into both nostrils as needed for congestion. 12/22/15 01/19/16  Leveda Anna, NP  topiramate (TOPAMAX) 50 MG tablet Take 1 tablet (50 mg total) 2 (two) times daily by mouth. 09/20/17   Penumalli, Earlean Polka, MD  triamcinolone cream (KENALOG) 0.1 % APPLY TWO TIMES A DAY EXTERNALLY TO AFFECTED AREAS. DO NOT USE FOR MORE THAN 7 CONSECUTIVE DAYS 12/20/17   Clent Demark, PA-C      Allergies    Patient has no known  allergies.    Review of Systems   Review of Systems  Musculoskeletal:  Positive for arthralgias.  All other systems reviewed and are negative.   Physical Exam Updated Vital Signs BP (!) 146/103 (BP Location: Left Arm)   Pulse 64   Temp 98.1 F (36.7 C) (Oral)   Resp 18   Ht 5' 7.75" (1.721 m)   Wt 113.4 kg   SpO2 98%   BMI 38.29 kg/m  Physical Exam Vitals and nursing note reviewed.  Constitutional:      General: She is not in acute distress.    Appearance: Normal appearance. She is normal weight. She is not ill-appearing, toxic-appearing or diaphoretic.  HENT:     Head: Normocephalic and atraumatic.  Cardiovascular:     Rate and Rhythm: Normal  rate.  Pulmonary:     Effort: Pulmonary effort is normal. No respiratory distress.  Musculoskeletal:        General: Normal range of motion.     Cervical back: Normal range of motion.     Comments: Mild bruising and tenderness to palpation noted to the anterior aspect of the right foot.  No obvious deformity or crepitus.  Distal sensation intact, capillary refill less than 2 seconds.  DP and PT pulses intact and 2+.  ROM intact with some discomfort.  No overlying wounds or nailbed involvement.  Skin:    General: Skin is warm and dry.  Neurological:     General: No focal deficit present.     Mental Status: She is alert.  Psychiatric:        Mood and Affect: Mood normal.        Behavior: Behavior normal.     ED Results / Procedures / Treatments   Labs (all labs ordered are listed, but only abnormal results are displayed) Labs Reviewed - No data to display  EKG None  Radiology DG Foot Complete Right  Result Date: 08/02/2022 CLINICAL DATA:  Right foot crush injury EXAM: RIGHT FOOT COMPLETE - 3+ VIEW COMPARISON:  None Available. FINDINGS: Status post right fifth metatarsal ORIF and arthrodesis of the second, third, and fourth proximal interphalangeal joints. There is no evidence of acute fracture or dislocation. There is no evidence of arthropathy or other focal bone abnormality. Soft tissues are unremarkable. IMPRESSION: No acute fracture or dislocation. Electronically Signed   By: Fidela Salisbury M.D.   On: 08/02/2022 01:23    Procedures Procedures    Medications Ordered in ED Medications  ibuprofen (ADVIL) tablet 800 mg (has no administration in time range)    ED Course/ Medical Decision Making/ A&P                           Medical Decision Making Amount and/or Complexity of Data Reviewed Radiology: ordered.  Risk Prescription drug management.   Patient presents today after right foot injury.  She is afebrile, nontoxic-appearing, and in no acute distress with  reassuring vital signs.  X-ray imaging obtained of the right foot which was unremarkable for acute findings.  I have personally reviewed and interpreted this imaging and agree with radiology interpretation.  She is able to ambulate with some discomfort and has good pulses and capillary refill.  Very low suspicion for crush injury.  No concern for compartment syndrome.  Patient given right ankle brace for support as well as some stretching exercises which she can try at home.  We will also give prescription for anti-inflammatory medication to help  with her pain.  Patient is stable for discharge, educated on red flag symptoms of prompt immediate return.  Patient is understanding and amenable with plan, discharged in stable condition.   Final Clinical Impression(s) / ED Diagnoses Final diagnoses:  Injury of right ankle, initial encounter    Rx / DC Orders ED Discharge Orders          Ordered    meloxicam (MOBIC) 7.5 MG tablet  Daily        08/02/22 0156          An After Visit Summary was printed and given to the patient.     Nestor Lewandowsky 08/02/22 0158    Truddie Hidden, MD 08/02/22 [phone]

## 2022-08-02 NOTE — ED Triage Notes (Signed)
Pt reports that she was trying to pick up a heavy metal bar and it fell on her foot. She wasn't able to get it off so she rolled it off her foot. She reports pain is worse with walking or flexing. Ambulatory.

## 2022-08-02 NOTE — Discharge Instructions (Signed)
As we discussed, x-ray imaging of your right ankle was negative for fracture or dislocation.  I have given you a brace this evening for support which you can wear as needed.  I have also given you prescription for an anti-inflammatory medication for you to take as prescribed as needed.  Additionally, I have attached some exercises for you to try at home to help rehabilitate your injury.  Follow-up with your primary care doctor in the next few days as needed for continued evaluation and management.  Return if development of any new or worsening symptoms.

## 2022-12-11 ENCOUNTER — Encounter (HOSPITAL_BASED_OUTPATIENT_CLINIC_OR_DEPARTMENT_OTHER): Payer: Self-pay | Admitting: Emergency Medicine

## 2022-12-11 ENCOUNTER — Emergency Department (HOSPITAL_BASED_OUTPATIENT_CLINIC_OR_DEPARTMENT_OTHER)
Admission: EM | Admit: 2022-12-11 | Discharge: 2022-12-11 | Disposition: A | Discharge status: Home / Self Care | Payer: BC Managed Care – PPO | Attending: Emergency Medicine | Admitting: Emergency Medicine

## 2022-12-11 ENCOUNTER — Other Ambulatory Visit: Payer: Self-pay

## 2022-12-11 DIAGNOSIS — Z7982 Long term (current) use of aspirin: Secondary | ICD-10-CM | POA: Insufficient documentation

## 2022-12-11 DIAGNOSIS — N898 Other specified noninflammatory disorders of vagina: Secondary | ICD-10-CM | POA: Insufficient documentation

## 2022-12-11 LAB — WET PREP, GENITAL
Sperm: NONE SEEN
Trich, Wet Prep: NONE SEEN
WBC, Wet Prep HPF POC: <10 (ref <10)
Yeast Wet Prep HPF POC: NONE SEEN

## 2022-12-11 LAB — URINALYSIS, ROUTINE W REFLEX MICROSCOPIC
Bilirubin Urine: NEGATIVE
Glucose, UA: NEGATIVE mg/dL
Hgb urine dipstick: NEGATIVE
Ketones, ur: NEGATIVE mg/dL
Leukocytes,Ua: NEGATIVE
Nitrite: NEGATIVE
Protein, ur: NEGATIVE mg/dL
Specific Gravity, Urine: 1.027 (ref 1.005–1.030)
pH: 6.5 (ref 5.0–8.0)

## 2022-12-11 MED ORDER — LIDOCAINE HCL (PF) 1 % IJ SOLN
1.0000 mL | Freq: Once | INTRAMUSCULAR | Status: AC
  Administered 2022-12-11: 2 mL
  Filled 2022-12-11: qty 5

## 2022-12-11 MED ORDER — CEFTRIAXONE SODIUM 500 MG IJ SOLR
500.0000 mg | Freq: Once | INTRAMUSCULAR | Status: AC
  Administered 2022-12-11: 500 mg via INTRAMUSCULAR
  Filled 2022-12-11: qty 500

## 2022-12-11 MED ORDER — DOXYCYCLINE HYCLATE 100 MG PO TABS: 100.0000 mg | ORAL_TABLET | Freq: Two times a day (BID) | ORAL | 0 refills | Status: AC

## 2022-12-11 MED ORDER — METRONIDAZOLE 500 MG PO TABS: 500.0000 mg | ORAL_TABLET | Freq: Two times a day (BID) | ORAL | 0 refills | Status: DC

## 2022-12-11 NOTE — Discharge Instructions (Addendum)
Take the antibiotics as prescribed.  Follow-up with your primary care doctor to be rechecked.

## 2022-12-11 NOTE — ED Provider Notes (Signed)
[place] Provider Note   CSN: [id number] Arrival date & time: 12/11/22  2051     History  Chief Complaint  Patient presents with   Hannah Palmer is a 27 y.o. female.  HPI   Patient presents to the ER for evaluation of irritation in the vaginal area as well as vaginal discharge.  Patient states she did recently have intercourse.  Following that she started to notice a sore in the inner aspect of her labia on the left side.  Somewhat itchy.  She has not noticed any blistered lesions.  She also has had some increased discharge.  Patient states she did noticed a little bit of bleeding initially.  Home Medications Prior to Admission medications   Medication Sig Start Date End Date Taking? Authorizing Provider  doxycycline (VIBRA-TABS) 100 MG tablet Take 1 tablet (100 mg total) by mouth 2 (two) times daily for 7 days. 12/11/22 12/18/22 Yes Dorie Rank, MD  metroNIDAZOLE (FLAGYL) 500 MG tablet Take 1 tablet (500 mg total) by mouth 2 (two) times daily. 12/11/22  Yes Dorie Rank, MD  acetaminophen (TYLENOL) 325 MG tablet Take 650 mg by mouth every 6 (six) hours as needed.    [provider]  amphetamine-dextroamphetamine (ADDERALL XR) 10 MG 24 hr capsule Take 10 mg by mouth 2 (two) times daily. Take between noon and 2 pm daily     [provider]  Aspirin-Acetaminophen-Caffeine (EXCEDRIN EXTRA STRENGTH PO) Take by mouth. As needed    [provider]  clotrimazole (LOTRIMIN) 1 % cream APPLY 1 APPLICATION TOPICALLY TWO TIMES A DAY 11/30/17   Clent Demark, PA-C  dicyclomine (BENTYL) 10 MG capsule Take 1 capsule (10 mg total) by mouth 3 (three) times daily before meals. 03/12/18   Clent Demark, PA-C  fluconazole (DIFLUCAN) 150 MG tablet Take 1 tablet (150 mg total) by mouth daily. 02/08/19   Bast, Tressia Miners A, NP  fluticasone (FLONASE) 50 MCG/ACT nasal spray Place 1 spray into the nose.  06/11/14   [provider]  GUANFACINE HCL PO Take 1 tablet by mouth daily.    [provider]  lubiprostone (AMITIZA) 8 MCG capsule Take 1 capsule (8 mcg total) by mouth 2 (two) times daily with a meal. 02/28/18   Clent Demark, PA-C  meloxicam (MOBIC) 7.5 MG tablet Take 1 tablet (7.5 mg total) by mouth daily. 08/02/22   Smoot, Sarah A, PA-C  NEOMYCIN-POLYMYXIN-HYDROCORTISONE (CORTISPORIN) 1 % SOLN OTIC solution PLACE 3 DROPS INTO THE LEFT EAR FOUR TIMES A DAY 11/30/17   Clent Demark, PA-C  polyethylene glycol powder [facility]) powder Take 17 g by mouth daily. 12/20/17   Clent Demark, PA-C  promethazine (PHENERGAN) 25 MG tablet Take 1 tablet (25 mg total) by mouth every 8 (eight) hours as needed for nausea or vomiting. 10/25/17   Trula Slade, DPM  Psyllium Husk POWD Take 5 g by mouth 2 (two) times daily. 03/06/18   Clent Demark, PA-C  ranitidine (ZANTAC 75) 75 MG tablet Take 1 tablet (75 mg total) by mouth 2 (two) times daily. 03/20/17   Clent Demark, PA-C  Selenium Sulfide 2.25 % SHAM Apply 1 application topically daily. 12/20/17   Clent Demark, PA-C  sodium chloride (OCEAN) 0.65 % SOLN nasal spray Place 2 sprays into both nostrils as needed for congestion. 12/22/15 01/19/16  Leveda Anna, NP  topiramate (TOPAMAX) 50 MG tablet Take 1  tablet (50 mg total) 2 (two) times daily by mouth. 09/20/17   Penumalli, Earlean Polka, MD  triamcinolone cream (KENALOG) 0.1 % APPLY TWO TIMES A DAY EXTERNALLY TO AFFECTED AREAS. DO NOT USE FOR MORE THAN 7 CONSECUTIVE DAYS 12/20/17   Clent Demark, PA-C      Allergies    Patient has no known allergies.    Review of Systems   Review of Systems  Physical Exam Updated Vital Signs BP (!) 165/79   Pulse 98   Temp 97.9 F (36.6 C) (Oral)   Resp 16   Ht 1.721 m (5' 7.75")   Wt 113 kg   SpO2 100%   BMI 38.16 kg/m  Physical Exam Vitals and nursing note reviewed.  Constitutional:      General: She is not in  acute distress.    Appearance: She is well-developed.  HENT:     Head: Normocephalic and atraumatic.     Right Ear: External ear normal.     Left Ear: External ear normal.  Eyes:     General: No scleral icterus.       Right eye: No discharge.        Left eye: No discharge.     Conjunctiva/sclera: Conjunctivae normal.  Neck:     Trachea: No tracheal deviation.  Cardiovascular:     Rate and Rhythm: Normal rate.  Pulmonary:     Effort: Pulmonary effort is normal. No respiratory distress.     Breath sounds: No stridor.  Abdominal:     General: There is no distension.  Genitourinary:    Comments: Small approximately 4 mm ulcerative type lesion approximately 1 mm in depth in her left labial, no vesicles, no erythema, grayish discharge noted Musculoskeletal:        General: No swelling or deformity.     Cervical back: Neck supple.  Skin:    General: Skin is warm and dry.     Findings: No rash.  Neurological:     Mental Status: She is alert. Mental status is at baseline.     Cranial Nerves: No dysarthria or facial asymmetry.     Motor: No seizure activity.     ED Results / Procedures / Treatments   Labs (all labs ordered are listed, but only abnormal results are displayed) Labs Reviewed  WET PREP, GENITAL - Abnormal; Notable for the following components:      Result Value   Clue Cells Wet Prep HPF POC PRESENT (*)    All other components within normal limits  HSV CULTURE AND TYPING  URINALYSIS, ROUTINE W REFLEX MICROSCOPIC  RPR  HIV ANTIBODY (ROUTINE TESTING W REFLEX)  GC/CHLAMYDIA PROBE AMP ([place]) NOT AT [facility]    EKG None  Radiology No results found.  Procedures Procedures    Medications Ordered in ED Medications  cefTRIAXone (ROCEPHIN) injection 500 mg (500 mg Intramuscular Given 12/11/22 2322)  lidocaine (PF) (XYLOCAINE) 1 % injection 1-2.1 mL (2 mLs Other Given 12/11/22 2322)    ED Course/ Medical Decision Making/ A&P                              Medical Decision Making Problems Addressed: Vaginal discharge: acute illness or injury  Amount and/or Complexity of Data Reviewed Labs: ordered. Decision-making details documented in ED Course.    Details: My prep does show clue cells.  No evidence of trichomoniasis.  Urinalysis does not suggest infection  Risk Prescription  drug management.   Patient complained of vaginal discharge and a small ulcerative type lesion in her inner labia.  On exam and almost looks like she may have had a small vaginal tear.  Does not clearly look like vesicular lesions to suggest HSV.  I did send off an HSV culture.  Patient also tested for chlamydia and gonorrhea..  Will discharge home empirically with doxycycline and Flagyl.        Final Clinical Impression(s) / ED Diagnoses Final diagnoses:  Vaginal discharge    Rx / DC Orders ED Discharge Orders          Ordered    doxycycline (VIBRA-TABS) 100 MG tablet  2 times daily        12/11/22 2217    metroNIDAZOLE (FLAGYL) 500 MG tablet  2 times daily        12/11/22 2331              Dorie Rank, MD 12/11/22 2333

## 2022-12-11 NOTE — ED Notes (Signed)
Pt agreeable with d/c plan as discussed by provider-this nurse has verbally reinforced d/c instructions and provided pt with written copy.  Pt acknowledges verbal understanding and denies any addl questions, concerns, needs -- ambulatory at d/c independently - gait steady; no acute distress/changes noted.  Vitals stable

## 2022-12-11 NOTE — ED Triage Notes (Addendum)
Arrives POV. No obvious distress. Reports genital sore inside labia- Has had a bartholin cyst before and says this is different. Also reports increased urinary frequency and difficulty stopping.Marland Kitchen

## 2022-12-12 LAB — HIV ANTIBODY (ROUTINE TESTING W REFLEX): HIV Screen 4th Generation wRfx: NONREACTIVE

## 2022-12-13 LAB — GC/CHLAMYDIA PROBE AMP (~~LOC~~) NOT AT ARMC
Chlamydia: NEGATIVE
Comment: NEGATIVE
Comment: NORMAL
Neisseria Gonorrhea: NEGATIVE

## 2022-12-13 LAB — RPR: RPR Ser Ql: NONREACTIVE

## 2022-12-14 LAB — HSV CULTURE AND TYPING

## 2023-01-27 ENCOUNTER — Ambulatory Visit: Payer: BC Managed Care – PPO | Admitting: Dietician

## 2023-03-09 ENCOUNTER — Encounter: Payer: Self-pay | Admitting: Dietician

## 2023-03-09 ENCOUNTER — Encounter: Payer: BC Managed Care – PPO | Attending: Physician Assistant | Admitting: Dietician

## 2023-03-09 VITALS — Ht 68.0 in | Wt 258.0 lb

## 2023-03-09 DIAGNOSIS — Z6839 Body mass index (BMI) 39.0-39.9, adult: Secondary | ICD-10-CM | POA: Insufficient documentation

## 2023-03-09 DIAGNOSIS — E669 Obesity, unspecified: Secondary | ICD-10-CM | POA: Insufficient documentation

## 2023-03-09 NOTE — Progress Notes (Signed)
Medical Nutrition Therapy  Appointment Start time:  [phone]  Appointment End time:  1049  Primary concerns today: prediabetes  Referral diagnosis: E 66.9 Preferred learning style: no preference indicated Learning readiness: ready   NUTRITION ASSESSMENT   Anthropometrics  Ht: 68 in Wt: 258 lbs  Clinical Medical Hx: allergies, depression Medications: reviewed Labs: 12/21/22: A1C 5.7%  Notable Signs/Symptoms: tired Food Allergies: none  Lifestyle & Dietary Hx Pt states she works two jobs. On Sat/Sun she works 12 hours. 7-3:30pm first job, 12am-4am second job. Pt states she sleeps in between two jobs (Mon/Tues). Pt states that she "binge eats" when she goes out to eat and gets fast food. Pt reports she goes to the grocery store but doesn't know what to eat to meal prep. Pt states she likes fruits and vegetables but is often tired after work and doesn't want to cook or know what to make, so she opts for [employer] or fast food.  Pt states she is lactose intolerant.  Estimated daily fluid intake: 32-48 oz Supplements: psyllium husk, multivitamin, vitamin C  Sleep: sleeps between two jobs (4:30-12 am) takes a nap in between the two jobs  Stress / self-care: moderate stress, sleeps helps unwind when stressed  Current average weekly physical activity: no longer working out due to lack of sleep, is going to try to start back on it   24-Hr Dietary Recall First Meal: skips Snack:  Second Meal:  Snack: (12 am OR noon) chips, candy bars Third Meal: after work (2-3 hours): sandwich or burrito from [employer] with milkshake OR [employer] double cheeseburger Snack: none Beverages: soda (ginger ale, Sprite, Fanta) 16 oz bottles, and water, flavored water (Cirkul bottles), occasional juices (carrot, orange, etc.) (V8 fruit juice)   NUTRITION DIAGNOSIS  [place]-2.2 Altered nutrition-related laboratory As related to prediabetes.  As evidenced by A1c 5.7%.   NUTRITION INTERVENTION  Nutrition education (E-1)  on the following topics:  Building balanced meals and snacks/MyPlate education Strategies to increase fruit and vegetable intake Prediabetes, A1c, insulin resistance, and benefits of physical activity on blood glucose control Importance of adequate sleep and stress management on health Recipe resources when meal planning  Handouts Provided Include  MyPlate  Snack Ideas  Learning Style & Readiness for Change Teaching method utilized: Visual & Auditory  Demonstrated degree of understanding via: Teach Back  Barriers to learning/adherence to lifestyle change: none  Goals Established by Pt Meal prep once a week for the whole week  Aim for 30 min activity for 5 days a week  Drink 64 oz of water a day    MONITORING & EVALUATION Dietary intake, weekly physical activity, and follow up in 3 months.  Next Steps  Patient is to call for questions.

## 2023-04-25 ENCOUNTER — Other Ambulatory Visit: Payer: Self-pay

## 2023-04-25 ENCOUNTER — Emergency Department (HOSPITAL_BASED_OUTPATIENT_CLINIC_OR_DEPARTMENT_OTHER)
Admission: EM | Admit: 2023-04-25 | Discharge: 2023-04-25 | Disposition: A | Discharge status: Home / Self Care | Payer: BC Managed Care – PPO | Attending: Emergency Medicine | Admitting: Emergency Medicine

## 2023-04-25 ENCOUNTER — Encounter (HOSPITAL_BASED_OUTPATIENT_CLINIC_OR_DEPARTMENT_OTHER): Payer: Self-pay

## 2023-04-25 DIAGNOSIS — M545 Low back pain, unspecified: Secondary | ICD-10-CM | POA: Diagnosis present

## 2023-04-25 DIAGNOSIS — Z7982 Long term (current) use of aspirin: Secondary | ICD-10-CM | POA: Insufficient documentation

## 2023-04-25 DIAGNOSIS — S39012A Strain of muscle, fascia and tendon of lower back, initial encounter: Secondary | ICD-10-CM | POA: Diagnosis not present

## 2023-04-25 DIAGNOSIS — X58XXXA Exposure to other specified factors, initial encounter: Secondary | ICD-10-CM | POA: Diagnosis not present

## 2023-04-25 LAB — URINALYSIS, ROUTINE W REFLEX MICROSCOPIC
Bacteria, UA: NONE SEEN
Bilirubin Urine: NEGATIVE
Glucose, UA: NEGATIVE mg/dL
Ketones, ur: NEGATIVE mg/dL
Leukocytes,Ua: NEGATIVE
Nitrite: NEGATIVE
Protein, ur: NEGATIVE mg/dL
Specific Gravity, Urine: 1.013 (ref 1.005–1.030)
pH: 7 (ref 5.0–8.0)

## 2023-04-25 LAB — PREGNANCY, URINE: Preg Test, Ur: NEGATIVE

## 2023-04-25 MED ORDER — NAPROXEN 500 MG PO TABS: 500.0000 mg | ORAL_TABLET | Freq: Two times a day (BID) | ORAL | 0 refills | Status: DC

## 2023-04-25 MED ORDER — CYCLOBENZAPRINE HCL 10 MG PO TABS
10.0000 mg | ORAL_TABLET | Freq: Once | ORAL | Status: AC
  Administered 2023-04-25: 10 mg via ORAL
  Filled 2023-04-25: qty 1

## 2023-04-25 MED ORDER — METHOCARBAMOL 500 MG PO TABS: 500.0000 mg | ORAL_TABLET | Freq: Two times a day (BID) | ORAL | 0 refills | Status: DC

## 2023-04-25 MED ORDER — IBUPROFEN 800 MG PO TABS
800.0000 mg | ORAL_TABLET | Freq: Once | ORAL | Status: AC
  Administered 2023-04-25: 800 mg via ORAL
  Filled 2023-04-25: qty 1

## 2023-04-25 NOTE — ED Notes (Signed)
Reviewed AVS with patient, patient expressed understanding of directions, denies further questions at this time. 

## 2023-04-25 NOTE — ED Provider Notes (Signed)
[place] EMERGENCY DEPARTMENT AT [facility] Provider Note   CSN: [id number] Arrival date & time: 04/25/23  2123     History  Chief Complaint  Patient presents with   Back Pain    Hannah Palmer is a 27 y.o. female.  27 year old female presents with complaint of right lower back pain onset 2 weeks ago, aching nature, worse with movement, progressively worsening.  Originally had some dysuria, went to urgent care and took antibiotics which she thought helped but had return of her back pain.  Does not have urinary symptoms.  States that she has loose stools at times and left side abdominal discomfort at times.  Denies fevers, chills, vomiting.  No other complaints or concerns today.  Is not taking anything for her pain at this time.       Home Medications Prior to Admission medications   Medication Sig Start Date End Date Taking? Authorizing Provider  acetaminophen (TYLENOL) 325 MG tablet Take 650 mg by mouth every 6 (six) hours as needed.    [provider]  amphetamine-dextroamphetamine (ADDERALL XR) 10 MG 24 hr capsule Take 10 mg by mouth 2 (two) times daily. Take between noon and 2 pm daily     [provider]  Aspirin-Acetaminophen-Caffeine (EXCEDRIN EXTRA STRENGTH PO) Take by mouth. As needed    [provider]  clotrimazole (LOTRIMIN) 1 % cream APPLY 1 APPLICATION TOPICALLY TWO TIMES A DAY 11/30/17   Loletta Specter, PA-C  dicyclomine (BENTYL) 10 MG capsule Take 1 capsule (10 mg total) by mouth 3 (three) times daily before meals. 03/12/18   Loletta Specter, PA-C  fluticasone [facility]) 50 MCG/ACT nasal spray Place 1 spray into the nose. 06/11/14   [provider]  methocarbamol (ROBAXIN) 500 MG tablet Take 1 tablet (500 mg total) by mouth 2 (two) times daily. 04/25/23   Jeannie Fend, PA-C  naproxen (NAPROSYN) 500 MG tablet Take 1 tablet (500 mg total) by mouth 2 (two) times daily. 04/25/23   Jeannie Fend, PA-C  Selenium  Sulfide 2.25 % SHAM Apply 1 application topically daily. 12/20/17   Loletta Specter, PA-C  sodium chloride (OCEAN) 0.65 % SOLN nasal spray Place 2 sprays into both nostrils as needed for congestion. 12/22/15 01/19/16  Estelle June, NP      Allergies    Patient has no known allergies.    Review of Systems   Review of Systems Negative except as per HPI Physical Exam Updated Vital Signs BP 115/71 (BP Location: Right Arm)   Pulse 98   Temp 98.4 F (36.9 C) (Oral)   Resp 18   SpO2 98%  Physical Exam Vitals and nursing note reviewed.  Constitutional:      General: She is not in acute distress.    Appearance: She is well-developed. She is not diaphoretic.  HENT:     Head: Normocephalic and atraumatic.  Pulmonary:     Effort: Pulmonary effort is normal.  Abdominal:     Palpations: Abdomen is soft.     Tenderness: There is no abdominal tenderness. There is no right CVA tenderness or left CVA tenderness.  Musculoskeletal:        General: Tenderness present. No swelling or deformity.     Cervical back: No tenderness or bony tenderness. No pain with movement.     Thoracic back: No tenderness or bony tenderness. Normal range of motion.     Lumbar back: Tenderness present. No bony tenderness. Normal range of motion.  Back:  Skin:    General: Skin is warm and dry.     Findings: No erythema or rash.  Neurological:     Mental Status: She is alert and oriented to person, place, and time.  Psychiatric:        Behavior: Behavior normal.     ED Results / Procedures / Treatments   Labs (all labs ordered are listed, but only abnormal results are displayed) Labs Reviewed  URINALYSIS, ROUTINE W REFLEX MICROSCOPIC - Abnormal; Notable for the following components:      Result Value   Hgb urine dipstick SMALL (*)    All other components within normal limits  PREGNANCY, URINE    EKG None  Radiology No results found.  Procedures Procedures    Medications Ordered in  ED Medications  ibuprofen (ADVIL) tablet 800 mg (800 mg Oral Given 04/25/23 2227)  cyclobenzaprine (FLEXERIL) tablet 10 mg (10 mg Oral Given 04/25/23 2227)    ED Course/ Medical Decision Making/ A&P                             Medical Decision Making Amount and/or Complexity of Data Reviewed Labs: ordered.   27 year old female with right lower back pain x 2 weeks as above.  Found to have right lower back pain with palpation as well as tenderness at right and left SI joints.  Normal range of motionOf the back.  Recommend course of NSAID with as needed muscle relaxant.  Can apply warm compresses and follow with gentle stretching.  Follow-up with PCP if pain is not improving.        Final Clinical Impression(s) / ED Diagnoses Final diagnoses:  Strain of lumbar region, initial encounter    Rx / DC Orders ED Discharge Orders          Ordered    methocarbamol (ROBAXIN) 500 MG tablet  2 times daily,   Status:  Discontinued        04/25/23 2223    naproxen (NAPROSYN) 500 MG tablet  2 times daily,   Status:  Discontinued        04/25/23 2223    methocarbamol (ROBAXIN) 500 MG tablet  2 times daily        04/25/23 2227    naproxen (NAPROSYN) 500 MG tablet  2 times daily        04/25/23 2227              Jeannie Fend, PA-C 04/25/23 2229    Charlynne Pander, MD 04/25/23 2337

## 2023-04-25 NOTE — Discharge Instructions (Signed)
Take naproxen with food.  Take Robaxin as needed as prescribed for muscle pain in your back.  Do not drive operate machinery if taking this medication.  Recheck with PCP if pain continues.  He can apply warm compresses for 20-minute at a time and follow gentle stretching, please see discharge instructions for exercises as discussed.

## 2023-04-25 NOTE — ED Triage Notes (Signed)
Patient here POV from Home.  Endorses Lower Back Pain for 2 Weeks. Constantly Aches. Has been taking an Antibiotic for Possible UTI. Seeks Evaluation for continued pain. Some nausea today and abd pain. No Emesis. No Diarrhea. No Dysuria.   NAD Noted during triage. A&Ox4. GCS 15. Ambulatory.

## 2023-06-08 ENCOUNTER — Ambulatory Visit: Payer: BC Managed Care – PPO | Admitting: Dietician

## 2023-12-28 ENCOUNTER — Other Ambulatory Visit: Payer: Self-pay | Admitting: Medical Genetics

## 2023-12-29 ENCOUNTER — Other Ambulatory Visit (HOSPITAL_COMMUNITY)
Admission: RE | Admit: 2023-12-29 | Discharge: 2023-12-29 | Disposition: A | Discharge status: Home / Self Care | Payer: Self-pay | Source: Ambulatory Visit | Attending: Medical Genetics | Admitting: Medical Genetics

## 2024-01-10 LAB — GENECONNECT MOLECULAR SCREEN: Genetic Analysis Overall Interpretation: NEGATIVE

## 2024-09-20 ENCOUNTER — Emergency Department (HOSPITAL_BASED_OUTPATIENT_CLINIC_OR_DEPARTMENT_OTHER)
Admission: EM | Admit: 2024-09-20 | Discharge: 2024-09-20 | Disposition: A | Discharge status: Home / Self Care | Attending: Emergency Medicine | Admitting: Emergency Medicine

## 2024-09-20 ENCOUNTER — Other Ambulatory Visit: Payer: Self-pay

## 2024-09-20 ENCOUNTER — Encounter (HOSPITAL_BASED_OUTPATIENT_CLINIC_OR_DEPARTMENT_OTHER): Payer: Self-pay

## 2024-09-20 DIAGNOSIS — M545 Low back pain, unspecified: Secondary | ICD-10-CM | POA: Insufficient documentation

## 2024-09-20 DIAGNOSIS — X500XXA Overexertion from strenuous movement or load, initial encounter: Secondary | ICD-10-CM | POA: Diagnosis not present

## 2024-09-20 MED ORDER — CYCLOBENZAPRINE HCL 10 MG PO TABS: 10.0000 mg | ORAL_TABLET | Freq: Two times a day (BID) | ORAL | 0 refills | Status: AC | PRN

## 2024-09-20 MED ORDER — CYCLOBENZAPRINE HCL 10 MG PO TABS
10.0000 mg | ORAL_TABLET | Freq: Once | ORAL | Status: AC
  Administered 2024-09-20: 10 mg via ORAL
  Filled 2024-09-20: qty 1

## 2024-09-20 MED ORDER — NAPROXEN 500 MG PO TABS: 500.0000 mg | ORAL_TABLET | Freq: Two times a day (BID) | ORAL | 0 refills | Status: AC

## 2024-09-20 MED ORDER — KETOROLAC TROMETHAMINE 30 MG/ML IJ SOLN
30.0000 mg | Freq: Once | INTRAMUSCULAR | Status: AC
  Administered 2024-09-20: 30 mg via INTRAMUSCULAR
  Filled 2024-09-20: qty 1

## 2024-09-20 NOTE — ED Triage Notes (Signed)
 Complaining of lower back pain that is chronic in nature. Thursday she had an exacerbation of it and this morning she moved a box which aggravated it.

## 2024-09-20 NOTE — ED Notes (Signed)
 Dc instructions given, pt verbalized understanding.out of ED with steady gait, all belongings, not in visible distress.

## 2024-09-20 NOTE — ED Provider Notes (Signed)
 [place] EMERGENCY DEPARTMENT AT [facility]  Provider Note  CSN: [id number] Arrival date & time: 09/20/24 0541  History Chief Complaint  Patient presents with   Back Pain    Hannah Palmer is a 28 y.o. female with no significant PMH reports about a week of low back pain, worsened since lifting a heavy box at home a few hours ago. Mid-lumbar, no radicular symptoms. No bowel or bladder incontinence, numbness or weakness. No fever. Took some Robaxin  without relief.    Home Medications Prior to Admission medications   Medication Sig Start Date End Date Taking? Authorizing Provider  cyclobenzaprine  (FLEXERIL ) 10 MG tablet Take 1 tablet (10 mg total) by mouth 2 (two) times daily as needed for muscle spasms. 09/20/24  Yes Roselyn Carlin NOVAK, MD  naproxen  (NAPROSYN ) 500 MG tablet Take 1 tablet (500 mg total) by mouth 2 (two) times daily. 09/20/24  Yes Roselyn Carlin NOVAK, MD  acetaminophen  (TYLENOL ) 325 MG tablet Take 650 mg by mouth every 6 (six) hours as needed.    [provider]  amphetamine -dextroamphetamine  (ADDERALL  XR) 10 MG 24 hr capsule Take 10 mg by mouth 2 (two) times daily. Take between noon and 2 pm daily     [provider]  Aspirin-Acetaminophen -Caffeine (EXCEDRIN EXTRA STRENGTH PO) Take by mouth. As needed    [provider]  clotrimazole  (LOTRIMIN ) 1 % cream APPLY 1 APPLICATION TOPICALLY TWO TIMES A DAY 11/30/17   Kristy Sharolyn Lenis, PA-C  Selenium  Sulfide 2.25 % SHAM Apply 1 application topically daily. 12/20/17   Kristy Sharolyn Lenis, PA-C  sodium chloride  (OCEAN) 0.65 % SOLN nasal spray Place 2 sprays into both nostrils as needed for congestion. 12/22/15 01/19/16  Belenda Macario HERO, NP     Allergies    Patient has no known allergies.   Review of Systems   Review of Systems Please see HPI for pertinent positives and negatives  Physical Exam BP 118/63 (BP Location: Right Arm)   Pulse 81   Temp 98.5 F (36.9 C) (Oral)   Resp 18   Ht 5'  7 (1.702 m)   Wt 125.2 kg   LMP 08/28/2024   SpO2 98%   BMI 43.23 kg/m   Physical Exam Vitals and nursing note reviewed.  Constitutional:      Appearance: Normal appearance.  HENT:     Head: Normocephalic and atraumatic.     Nose: Nose normal.     Mouth/Throat:     Mouth: Mucous membranes are moist.  Eyes:     Extraocular Movements: Extraocular movements intact.     Conjunctiva/sclera: Conjunctivae normal.  Cardiovascular:     Rate and Rhythm: Normal rate.  Pulmonary:     Effort: Pulmonary effort is normal.     Breath sounds: Normal breath sounds.  Abdominal:     General: Abdomen is flat.     Palpations: Abdomen is soft.     Tenderness: There is no abdominal tenderness.  Musculoskeletal:        General: Tenderness (mild, diffuse lumbar) present. No swelling. Normal range of motion.     Cervical back: Neck supple.  Skin:    General: Skin is warm and dry.  Neurological:     General: No focal deficit present.     Mental Status: She is alert.     Sensory: No sensory deficit.     Motor: No weakness.     Deep Tendon Reflexes: Reflexes normal.  Psychiatric:  Mood and Affect: Mood normal.     ED Results / Procedures / Treatments   EKG None  Procedures Procedures  Medications Ordered in the ED Medications  ketorolac (TORADOL) 30 MG/ML injection 30 mg (has no administration in time range)  cyclobenzaprine  (FLEXERIL ) tablet 10 mg (has no administration in time range)    Initial Impression and Plan  Patient here with MSK low back pain, no red flags or concern for acute cord compression. Plan Toradol, Flexeril  and PCP follow up, RTED for any other concerns.    ED Course       MDM Rules/Calculators/A&P Medical Decision Making Problems Addressed: Acute midline low back pain without sciatica: acute illness or injury  Risk Prescription drug management.     Final Clinical Impression(s) / ED Diagnoses Final diagnoses:  Acute midline low back pain  without sciatica    Rx / DC Orders ED Discharge Orders          Ordered    naproxen  (NAPROSYN ) 500 MG tablet  2 times daily        09/20/24 0602    cyclobenzaprine  (FLEXERIL ) 10 MG tablet  2 times daily PRN        09/20/24 0602             Roselyn Carlin NOVAK, MD 09/20/24 [phone]
